# Patient Record
Sex: Female | Born: 1960 | Marital: Married | Smoking: Never smoker
Health system: Southern US, Community
[De-identification: ages and names within clinical notes are randomized; demographics above are authoritative.]

## PROBLEM LIST (undated history)

## (undated) DIAGNOSIS — S4990XA Unspecified injury of shoulder and upper arm, unspecified arm, initial encounter: Secondary | ICD-10-CM

## (undated) HISTORY — PX: ABDOMINAL HYSTERECTOMY: SHX81

---

## 2015-06-16 ENCOUNTER — Emergency Department (HOSPITAL_COMMUNITY): Payer: BLUE CROSS/BLUE SHIELD

## 2015-06-16 ENCOUNTER — Encounter (HOSPITAL_COMMUNITY): Payer: Self-pay | Admitting: Cardiology

## 2015-06-16 ENCOUNTER — Inpatient Hospital Stay (HOSPITAL_COMMUNITY)
Admission: EM | Admit: 2015-06-16 | Discharge: 2015-06-18 | DRG: 552 | Disposition: A | Discharge status: Home / Self Care | Payer: BLUE CROSS/BLUE SHIELD | Attending: Internal Medicine | Admitting: Internal Medicine

## 2015-06-16 ENCOUNTER — Inpatient Hospital Stay (HOSPITAL_COMMUNITY): Payer: BLUE CROSS/BLUE SHIELD

## 2015-06-16 DIAGNOSIS — M50122 Cervical disc disorder at C5-C6 level with radiculopathy: Secondary | ICD-10-CM | POA: Diagnosis present

## 2015-06-16 DIAGNOSIS — M5416 Radiculopathy, lumbar region: Secondary | ICD-10-CM | POA: Diagnosis not present

## 2015-06-16 DIAGNOSIS — M5116 Intervertebral disc disorders with radiculopathy, lumbar region: Principal | ICD-10-CM | POA: Diagnosis present

## 2015-06-16 DIAGNOSIS — M4802 Spinal stenosis, cervical region: Secondary | ICD-10-CM | POA: Diagnosis present

## 2015-06-16 DIAGNOSIS — M5136 Other intervertebral disc degeneration, lumbar region: Secondary | ICD-10-CM

## 2015-06-16 DIAGNOSIS — M545 Low back pain: Secondary | ICD-10-CM | POA: Diagnosis not present

## 2015-06-16 DIAGNOSIS — M549 Dorsalgia, unspecified: Secondary | ICD-10-CM | POA: Diagnosis present

## 2015-06-16 DIAGNOSIS — M5030 Other cervical disc degeneration, unspecified cervical region: Secondary | ICD-10-CM | POA: Insufficient documentation

## 2015-06-16 DIAGNOSIS — R262 Difficulty in walking, not elsewhere classified: Secondary | ICD-10-CM

## 2015-06-16 DIAGNOSIS — M5412 Radiculopathy, cervical region: Secondary | ICD-10-CM | POA: Diagnosis present

## 2015-06-16 DIAGNOSIS — M51369 Other intervertebral disc degeneration, lumbar region without mention of lumbar back pain or lower extremity pain: Secondary | ICD-10-CM

## 2015-06-16 DIAGNOSIS — R531 Weakness: Secondary | ICD-10-CM

## 2015-06-16 DIAGNOSIS — M4806 Spinal stenosis, lumbar region: Secondary | ICD-10-CM | POA: Diagnosis present

## 2015-06-16 HISTORY — DX: Unspecified injury of shoulder and upper arm, unspecified arm, initial encounter: S49.90XA

## 2015-06-16 LAB — CBC WITH DIFFERENTIAL/PLATELET
Basophils Absolute: 0 10*3/uL (ref 0.0–0.1)
Basophils Relative: 1 %
EOS ABS: 0 10*3/uL (ref 0.0–0.7)
EOS PCT: 0 %
HEMATOCRIT: 36.9 % (ref 36.0–46.0)
Hemoglobin: 12.6 g/dL (ref 12.0–15.0)
LYMPHS PCT: 28 %
Lymphs Abs: 1.7 10*3/uL (ref 0.7–4.0)
MCH: 29.8 pg (ref 26.0–34.0)
MCHC: 34.1 g/dL (ref 30.0–36.0)
MCV: 87.2 fL (ref 78.0–100.0)
Monocytes Absolute: 0.4 10*3/uL (ref 0.1–1.0)
Monocytes Relative: 6 %
Neutro Abs: 4.1 10*3/uL (ref 1.7–7.7)
Neutrophils Relative %: 65 %
PLATELETS: 247 10*3/uL (ref 150–400)
RBC: 4.23 MIL/uL (ref 3.87–5.11)
RDW: 13.4 % (ref 11.5–15.5)
WBC: 6.2 10*3/uL (ref 4.0–10.5)

## 2015-06-16 LAB — URINALYSIS, ROUTINE W REFLEX MICROSCOPIC
BILIRUBIN URINE: NEGATIVE
Glucose, UA: NEGATIVE mg/dL
HGB URINE DIPSTICK: NEGATIVE
Ketones, ur: NEGATIVE mg/dL
Leukocytes, UA: NEGATIVE
NITRITE: NEGATIVE
PROTEIN: NEGATIVE mg/dL
Specific Gravity, Urine: 1.015 (ref 1.005–1.030)
pH: 8.5 — ABNORMAL HIGH (ref 5.0–8.0)

## 2015-06-16 LAB — MAGNESIUM: MAGNESIUM: 2.1 mg/dL (ref 1.7–2.4)

## 2015-06-16 LAB — COMPREHENSIVE METABOLIC PANEL
ALT: 14 U/L (ref 14–54)
AST: 19 U/L (ref 15–41)
Albumin: 4.2 g/dL (ref 3.5–5.0)
Alkaline Phosphatase: 47 U/L (ref 38–126)
Anion gap: 10 (ref 5–15)
BILIRUBIN TOTAL: 0.8 mg/dL (ref 0.3–1.2)
BUN: 13 mg/dL (ref 6–20)
CHLORIDE: 105 mmol/L (ref 101–111)
CO2: 23 mmol/L (ref 22–32)
Calcium: 9.3 mg/dL (ref 8.9–10.3)
Creatinine, Ser: 0.63 mg/dL (ref 0.44–1.00)
Glucose, Bld: 97 mg/dL (ref 65–99)
POTASSIUM: 3.4 mmol/L — AB (ref 3.5–5.1)
Sodium: 138 mmol/L (ref 135–145)
TOTAL PROTEIN: 7.6 g/dL (ref 6.5–8.1)

## 2015-06-16 MED ORDER — ALUM & MAG HYDROXIDE-SIMETH 200-200-20 MG/5ML PO SUSP: 30.0000 mL | Freq: Four times a day (QID) | ORAL | Status: DC | PRN

## 2015-06-16 MED ORDER — POLYETHYLENE GLYCOL 3350 17 G PO PACK: 17.0000 g | PACK | Freq: Every day | ORAL | Status: DC | PRN

## 2015-06-16 MED ORDER — HYDROMORPHONE HCL 1 MG/ML IJ SOLN
0.5000 mg | Freq: Once | INTRAMUSCULAR | Status: AC
  Administered 2015-06-16: 0.5 mg via INTRAVENOUS
  Filled 2015-06-16: qty 1

## 2015-06-16 MED ORDER — PROMETHAZINE HCL 12.5 MG PO TABS: 12.5000 mg | ORAL_TABLET | Freq: Four times a day (QID) | ORAL | Status: DC | PRN

## 2015-06-16 MED ORDER — ONDANSETRON HCL 4 MG/2ML IJ SOLN
4.0000 mg | Freq: Once | INTRAMUSCULAR | Status: AC
  Administered 2015-06-16: 4 mg via INTRAVENOUS
  Filled 2015-06-16: qty 2

## 2015-06-16 MED ORDER — OXYCODONE HCL 5 MG PO TABS: 5.0000 mg | ORAL_TABLET | ORAL | Status: DC | PRN

## 2015-06-16 MED ORDER — SODIUM CHLORIDE 0.9 % IV SOLN
INTRAVENOUS | Status: DC
  Administered 2015-06-16: 16:00:00 via INTRAVENOUS

## 2015-06-16 MED ORDER — ACETAMINOPHEN 325 MG PO TABS: 650.0000 mg | ORAL_TABLET | Freq: Four times a day (QID) | ORAL | Status: DC | PRN

## 2015-06-16 MED ORDER — SODIUM CHLORIDE 0.9 % IJ SOLN
3.0000 mL | Freq: Two times a day (BID) | INTRAMUSCULAR | Status: DC
  Administered 2015-06-16 – 2015-06-17 (×2): 3 mL via INTRAVENOUS

## 2015-06-16 MED ORDER — MORPHINE SULFATE (PF) 4 MG/ML IV SOLN
4.0000 mg | Freq: Once | INTRAVENOUS | Status: AC
  Administered 2015-06-16: 4 mg via INTRAVENOUS
  Filled 2015-06-16: qty 1

## 2015-06-16 MED ORDER — ENOXAPARIN SODIUM 40 MG/0.4ML ~~LOC~~ SOLN
40.0000 mg | SUBCUTANEOUS | Status: DC
  Filled 2015-06-16: qty 0.4

## 2015-06-16 MED ORDER — LEVALBUTEROL HCL 0.63 MG/3ML IN NEBU: 0.6300 mg | INHALATION_SOLUTION | Freq: Four times a day (QID) | RESPIRATORY_TRACT | Status: DC | PRN

## 2015-06-16 MED ORDER — KETOROLAC TROMETHAMINE 30 MG/ML IJ SOLN
30.0000 mg | Freq: Once | INTRAMUSCULAR | Status: AC
  Administered 2015-06-16: 30 mg via INTRAVENOUS
  Filled 2015-06-16: qty 1

## 2015-06-16 MED ORDER — METHYLPREDNISOLONE SODIUM SUCC 125 MG IJ SOLR
80.0000 mg | Freq: Once | INTRAMUSCULAR | Status: AC
  Administered 2015-06-16: 80 mg via INTRAVENOUS
  Filled 2015-06-16: qty 2

## 2015-06-16 MED ORDER — POTASSIUM CHLORIDE IN NACL 40-0.9 MEQ/L-% IV SOLN
INTRAVENOUS | Status: DC
  Administered 2015-06-16: 75 mL/h via INTRAVENOUS

## 2015-06-16 MED ORDER — HYDROMORPHONE HCL 1 MG/ML IJ SOLN: 0.5000 mg | INTRAMUSCULAR | Status: DC | PRN

## 2015-06-16 MED ORDER — METHYLPREDNISOLONE SODIUM SUCC 125 MG IJ SOLR
80.0000 mg | Freq: Two times a day (BID) | INTRAMUSCULAR | Status: DC
  Administered 2015-06-16 – 2015-06-17 (×2): 80 mg via INTRAVENOUS
  Filled 2015-06-16 (×2): qty 2

## 2015-06-16 MED ORDER — GADOBENATE DIMEGLUMINE 529 MG/ML IV SOLN
10.0000 mL | Freq: Once | INTRAVENOUS | Status: AC | PRN
  Administered 2015-06-16: 10 mL via INTRAVENOUS

## 2015-06-16 MED ORDER — MORPHINE SULFATE (PF) 2 MG/ML IV SOLN
2.0000 mg | Freq: Once | INTRAVENOUS | Status: AC
  Administered 2015-06-16: 2 mg via INTRAVENOUS
  Filled 2015-06-16: qty 1

## 2015-06-16 MED ORDER — ACETAMINOPHEN 650 MG RE SUPP: 650.0000 mg | Freq: Four times a day (QID) | RECTAL | Status: DC | PRN

## 2015-06-16 MED ORDER — METHOCARBAMOL 1000 MG/10ML IJ SOLN
500.0000 mg | Freq: Three times a day (TID) | INTRAVENOUS | Status: DC | PRN
  Administered 2015-06-16: 500 mg via INTRAVENOUS
  Filled 2015-06-16: qty 5

## 2015-06-16 NOTE — ED Provider Notes (Signed)
CSN: [id number]     Arrival date & time 06/16/15  1059 History   First MD Initiated Contact with Patient 06/16/15 1109     Chief Complaint  Patient presents with  . Back Pain     (Consider location/radiation/quality/duration/timing/severity/associated sxs/prior Treatment) HPI Comments: Patient is a 54 year old female who presents to the emergency department with a complaint of low back pain. The patient's husband as the primary historian. He states that she has been having problems since May of this year. She has been seen by a [facility] for orthopedics in the past because of shoulder problems and was told that she did not have arthritis there, and was placed on physical therapy. She continued to have pain and would wake up with pain and in the last 9 days has been having problems with pain both in the shoulder and in the lower back area. The patient was having problems with stiffness of the muscles and back a week ago and was eventually seen by chiropractic physician for massage, acupuncture, and other physical therapies. This helped for short period of time, but the pain came back, and was worse. The patient was seen on yesterday December 5 chiropractic again with acupuncture, massage, and other modalities without significant improvement in the back pain or shoulder pain. The patient's custom is to sleep on the floor, she is now in a position where that she has to wear depends because she cannot get up off the lower due to the severity of her pain. She came to the emergency department today by EMS for evaluation and for assistance with her pain.  Patient is a 54 y.o. female presenting with back pain. The history is provided by the spouse. The history is limited by a language barrier (husband interprets for patient.).  Back Pain   Past Medical History  Diagnosis Date  . Shoulder injury    Past Surgical History  Procedure Laterality Date  . Abdominal hysterectomy     History reviewed. No  pertinent family history. Social History  Substance Use Topics  . Smoking status: None  . Smokeless tobacco: None  . Alcohol Use: None   OB History    No data available     Review of Systems  Musculoskeletal: Positive for back pain, arthralgias and neck pain.  All other systems reviewed and are negative.     Allergies  Review of patient's allergies indicates no known allergies.  Home Medications   Prior to Admission medications   Not on File   BP 140/69 mmHg  Pulse 68  Temp(Src) 98.2 F (36.8 C) (Oral)  Resp 20  Ht 5\' 6"  (1.676 m)  Wt 56.246 kg  BMI 20.02 kg/m2  SpO2 100% Physical Exam  Constitutional: She is oriented to person, place, and time. She appears well-developed and well-nourished.  Non-toxic appearance.  HENT:  Head: Normocephalic.  Right Ear: Tympanic membrane and external ear normal.  Left Ear: Tympanic membrane and external ear normal.  Eyes: EOM and lids are normal. Pupils are equal, round, and reactive to light.  Neck: Normal range of motion. Neck supple. Carotid bruit is not present.  Cardiovascular: Normal rate, regular rhythm, normal heart sounds, intact distal pulses and normal pulses.   Pulmonary/Chest: Breath sounds normal. No respiratory distress.  Abdominal: Soft. Bowel sounds are normal. There is no tenderness. There is no guarding.  Musculoskeletal: Normal range of motion.  There is pain of the right greater than left shoulders with range of motion. There is no evidence for  dislocation. There no hot joints appreciated. This full range of motion of both the right and left elbow, wrist, and fingers.  There is soreness of the upper trapezius right greater than left. There is no palpable step off of the cervical, thoracic, or lumbar spine. There is tenderness of the cervical paraspinal area, and the lumbar paraspinal area. There is pain to palpation along the lumbar spinal area. No hot areas appreciated.  Lymphadenopathy:       Head (right  side): No submandibular adenopathy present.       Head (left side): No submandibular adenopathy present.    She has no cervical adenopathy.  Neurological: She is alert and oriented to person, place, and time. She has normal strength. No cranial nerve deficit or sensory deficit.  Examination is limited because of pain. The grip is symmetrical. The patient can flex the knees, and wrist of the lateral and the stretcher. She has severe pain when asked to bring her knees toward her chest, or to extend her legs. No sensory deficits of the upper or lower extremities identified.  Skin: Skin is warm and dry.  Psychiatric: She has a normal mood and affect. Her speech is normal.  Nursing note and vitals reviewed.   ED Course Pt seen with me by Dr D. Verdie MoAlgis Downssher.  Procedures (including critical care time) Labs Review Labs Reviewed - No data to display  Imaging Review No results found. I have personally reviewed and evaluated these images and lab results as part of my medical decision-making.   EKG Interpretation None      MDM  Urine analysis is well within normal limits. Complete blood count is well within normal limits. [facility] metabolic panel shows potassium to be slightly low at 3.4, otherwise normal.  CT scan of the cervical spine reveals cervical spondylosis and degenerative disc disease with potential impingement at the C5-C6 area. 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 There is also a small disc protrusion at C2-C3 and C3-C4 area with borderline central narrowing of the thecal sac.  The CT scan of the lumbar spine reveals a left lateral recess disc extrusion extending caudal and impinging on the left L4 nerve root. There is also borderline impingement at the L4-L5 area due to degenerative disc disease.  The patient was treated initially with 2 mg of morphine and 30 mg of Toradol with only  minimal improvement in pain. The patient was then treated with 4 mg of morphine with minimal improvement in pain. The patient was then treated with 0.5 mg of Dilaudid with minimal improvement in pain, patient still unable to get out of the bed because of pain. Will discuss admission with the hospitalist.    Final diagnoses:  Intractable back pain  DDD (degenerative disc disease), lumbar  DDD (degenerative disc disease), cervical    **I have reviewed nursing notes, vital signs, and all appropriate lab and imaging results for this patient.Ivery Quale, PA-C 06/16/15 1601  Lavera Guise, MD 06/17/15 [phone]

## 2015-06-16 NOTE — ED Notes (Signed)
H. Bryant, PA at bedside. 

## 2015-06-16 NOTE — ED Notes (Signed)
Lower back pain for 9 days.  States pain came on after getting up out of the floor. Has been to a chiropractor last Thursday and acupuncture yesterday.  Pain better shortly after visiting chiropractor.   States now pain is worse and also c/o pain in neck.  Pt unable to get up ,  Had to call EMS for transport.

## 2015-06-16 NOTE — H&P (Addendum)
[facility] History and Physical  Aarohi Redditt VWU:[id number] DOB: [birth year] DOA: 06/16/2015  Referring physician:   PCP: No primary care provider on file.   Chief Complaint: *Intractable back pain, shoulder pain  HPI:  54 year old female with no significant past medical history presents with intractable cervical and lumbar pain for the last 9 days. Patient states that the pain has progressively becoming worse. She did  have acupuncture and chiropractic treatment without any significant improvement in her back pain. She has not taken any NSAIDs or painkillers. She has not taken any muscle relaxants. She has not seen her PCP. Since yesterday the patient has had difficulty ambulating and inability to get off her mattress on the floor. She denies any trauma, denies any urinary or stool incontinence. She thinks her left leg is weaker than her right leg.  CT cervical spine shows cervical spondylosis and degenerative changes with potential impingement at C5-C6, disc protrusions at C2-C3 and C3-C4 with borderline associated central narrowing of the thecal sac. At L3-4, left lateral recess disc extrusion extending caudate and likely impinging the left L4 nerve root. Borderline impingement at L4-L5 due to degenerative disc disease    Review of Systems: negative for the following  Constitutional: Denies fever, chills, diaphoresis, appetite change and fatigue.  HEENT: Denies photophobia, eye pain, redness, hearing loss, ear pain, congestion, sore throat, rhinorrhea, sneezing, mouth sores, trouble swallowing, neck pain, neck stiffness and tinnitus.  Respiratory: Denies SOB, DOE, cough, chest tightness, and wheezing.  Cardiovascular: Denies chest pain, palpitations and leg swelling.  Gastrointestinal: Denies nausea, vomiting, abdominal pain, diarrhea, constipation, blood in stool and abdominal distention.  Genitourinary: Denies dysuria, urgency, frequency, hematuria, flank pain and difficulty urinating.   Musculoskeletal: Positive for back pain, arthralgias and neck painSkin: Denies pallor, rash and wound.  Neurological: Denies dizziness, seizures, syncope, weakness, light-headedness, numbness and headaches.  Hematological: Denies adenopathy. Easy bruising, personal or family bleeding history  Psychiatric/Behavioral: Denies suicidal ideation, mood changes, confusion, nervousness, sleep disturbance and agitation       Past Medical History  Diagnosis Date  . Shoulder injury      Past Surgical History  Procedure Laterality Date  . Abdominal hysterectomy        Social History:  has no tobacco, alcohol, and drug history on file.    No Known Allergies      FAMILY HISTORY  When questioned  Directly-patient reports  No family history of HTN, CVA ,DIABETES, TB, Cancer CAD, Bleeding Disorders, Sickle Cell, diabetes, anemia, asthma,   Prior to Admission medications   Medication Sig Start Date End Date Taking? Authorizing Provider  vitamin B-12 (CYANOCOBALAMIN) 1000 MCG tablet Take 1,000 mcg by mouth daily.   Yes Historical Provider, MD     Physical Exam: Filed Vitals:   06/16/15 1530 06/16/15 1600 06/16/15 1630 06/16/15 1645  BP: 132/76 118/76 122/76 122/76  Pulse:   61 88  Temp:    98.3 F (36.8 C)  TempSrc:    Oral  Resp:    14  Height:      Weight:      SpO2:   98% 100%     Constitutional: Vital signs reviewed. Patient is a well-developed and well-nourished in no acute distress and cooperative with exam. Alert and oriented x3.  Head: Normocephalic and atraumatic  Ear: TM normal bilaterally  Mouth: no erythema or exudates, MMM  Eyes: PERRL, EOMI, conjunctivae normal, No scleral icterus.  Neck: Supple, Trachea midline normal ROM, No JVD, mass, thyromegaly, or carotid bruit  present.  Cardiovascular: RRR, S1 normal, S2 normal, no MRG, pulses symmetric and intact bilaterally  Pulmonary/Chest: CTAB, no wheezes, rales, or rhonchi  Abdominal: Soft. Non-tender,  non-distended, bowel sounds are normal, no masses, organomegaly, or guarding present.  GU: no CVA tenderness Musculoskeletal: No joint deformities, erythema, or stiffness, ROM full and no nontender Ext: no edema and no cyanosis, pulses palpable bilaterally (DP and PT)  Hematology: no cervical, inginal, or axillary adenopathy.  Musculoskeletal: Normal range of motion.  There is pain of the right greater than left shoulders with range of motion. There is no evidence for dislocation. There no hot joints appreciated. This full range of motion of both the right and left elbow, wrist, and fingers.  There is soreness of the upper trapezius right greater than left. There is no palpable step off of the cervical, thoracic, or lumbar spine. There is tenderness of the cervical paraspinal area, and the lumbar paraspinal area. There is pain to palpation along the lumbar spinal area. No hot areas appreciated.  Lymphadenopathy:   Head (right side): No submandibular adenopathy present.   Head (left side): No submandibular adenopathy present.   She has no cervical adenopathy.  Neurological: She is alert and oriented to person, place, and time. She has normal strength. No cranial nerve deficit or sensory deficit.  Examination is limited because of pain. The grip is symmetrical. The patient can flex the knees, and wrist of the lateral and the stretcher. She has severe pain when asked to bring her knees toward her chest, or to extend her legs. No sensory deficits of the upper or lower extremities identified.  Skin: Warm, dry and intact. No rash, cyanosis, or clubbing.  Psychiatric: Normal mood and affect. speech and behavior is normal. Judgment and thought content normal. Cognition and memory are normal.      Data Review   Micro Results No results found for this or any previous visit (from the past 240 hour(s)).  Radiology Reports Ct Cervical Spine Wo Contrast  06/16/2015  CLINICAL DATA:  Neck pain  after standing from floor. Recent chiropractor visit for back pain. EXAM: CT CERVICAL SPINE WITHOUT CONTRAST TECHNIQUE: Multidetector CT imaging of the cervical spine was performed without intravenous contrast. Multiplanar CT image reconstructions were also generated. COMPARISON:  None. FINDINGS: Ossification of the posterior longitudinal ligament extending from the C7 level down to the lower T1 level as shown on images 31-32 of series 5. 2 mm posterior subluxation at C5-6 thought to be degenerative. Uncinate spurring, mild facet arthropathy, and possible degenerative disc disease in the right neural foramen at C5-6 a potential cause for right foraminal impingement. There is also some left uncinate spurring at C5-6 and C6-7 without overt osseous foraminal stenosis at these levels. No prevertebral soft tissue swelling. I suspect small central disc protrusions at C2-3 and C3-4 and there is potentially mild central narrowing of the thecal sac at C5-6 due to disc bulge. Biapical pleural parenchymal scarring appears relatively symmetric. Upper normal size bilateral station 2 lymph nodes including an 8 mm short axis lymph node on image 35 series 3. IMPRESSION: 1. Cervical spondylosis and degenerative disc disease with potential impingement at C5-6. 2. 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 3. 2 mm degenerative posterior subluxation at C5-6. 4. In the lungs, there is biapical pleural parenchymal scarring which appear symmetric. 5. Small central disc protrusions at C2- 3 and C3-4 with borderline associated central narrowing of the thecal sac. Electronically Signed   By: Zollie Beckers  Ova Freshwater M.D.   On: 06/16/2015 13:05   Ct Lumbar Spine Wo Contrast  06/16/2015  CLINICAL DATA:  Low back pain for 9 days, difficulty standing or moving. EXAM: CT LUMBAR SPINE WITHOUT CONTRAST TECHNIQUE: Multidetector CT imaging of the lumbar spine was performed without intravenous contrast administration. Multiplanar  CT image reconstructions were also generated. COMPARISON:  None. FINDINGS: The lowest lumbar type non-rib-bearing vertebra is labeled as L5. No lumbar spine fracture or malalignment is observed. The visualized abdominal contents unremarkable on noncontrast CT appearance, including a normal-appearing appendix. Additional findings at individual levels are as follows: L1-2: Unremarkable. L2-3:  Unremarkable. L3-4: Left lateral recess disc extrusion is suspected to extend caudad and probably causes impingement on the left L4 nerve roots. Left eccentric disc bulge with borderline central narrowing of the thecal sac. L4-5: Borderline central narrowing of the thecal sac and borderline bilateral subarticular lateral recess stenosis due to disc bulge. L5-S1:  No impingement seen.  Mild disc bulge. IMPRESSION: 1. At L3-4, I suspect a left lateral recess disc extrusion extending caudad and likely impinging on the left L4 nerve roots. 2. Borderline impingement at L4-5 due to degenerative disc disease. 3. No fracture or malalignment identified. Electronically Signed   By: Gaylyn Rong M.D.   On: 06/16/2015 13:11     CBC  Recent Labs Lab 06/16/15 1238  WBC 6.2  HGB 12.6  HCT 36.9  PLT 247  MCV 87.2  MCH 29.8  MCHC 34.1  RDW 13.4  LYMPHSABS 1.7  MONOABS 0.4  EOSABS 0.0  BASOSABS 0.0    Chemistries   Recent Labs Lab 06/16/15 1238  NA 138  K 3.4*  CL 105  CO2 23  GLUCOSE 97  BUN 13  CREATININE 0.63  CALCIUM 9.3  MG 2.1  AST 19  ALT 14  ALKPHOS 47  BILITOT 0.8   ------------------------------------------------------------------------------------------------------------------ estimated creatinine clearance is 70.5 mL/min (by C-G formula based on Cr of 0.63). ------------------------------------------------------------------------------------------------------------------ No results for input(s): HGBA1C in the last 72  hours. ------------------------------------------------------------------------------------------------------------------ No results for input(s): CHOL, HDL, LDLCALC, TRIG, CHOLHDL, LDLDIRECT in the last 72 hours. ------------------------------------------------------------------------------------------------------------------ No results for input(s): TSH, T4TOTAL, T3FREE, THYROIDAB in the last 72 hours.  Invalid input(s): FREET3 ------------------------------------------------------------------------------------------------------------------ No results for input(s): VITAMINB12, FOLATE, FERRITIN, TIBC, IRON, RETICCTPCT in the last 72 hours.  Coagulation profile No results for input(s): INR, PROTIME in the last 168 hours.  No results for input(s): DDIMER in the last 72 hours.  Cardiac Enzymes No results for input(s): CKMB, TROPONINI, MYOGLOBIN in the last 168 hours.  Invalid input(s): CK ------------------------------------------------------------------------------------------------------------------ Invalid input(s): POCBNP   CBG: No results for input(s): GLUCAP in the last 168 hours.     EKG: Independently reviewed.     Assessment/Plan Principal Problem:   Intractable back pain-multiple findings on CT cervical and lumbar spine Discussed with Dr. Bevely Palmer with [facility], he recommends MRI He recommends continued treatment with steroids and muscle relaxants He recommends MRI of the cervical spine and the lumbar spine Please call him back tonight if the MRI is grossly of normal Patient may need transfer to [facility] for further management of her back pain tonight, if any signs of cord compression      Code Status:   full Family Communication: Discussed with the patient's husband by the bedside Disposition Plan: admit   Total time spent 55 minutes.Greater than 50% of this time was spent in counseling, explanation of diagnosis, planning of further management,  and coordination of care  [facility] Pager [phone]  If 7PM-7AM, please contact night-coverage [email or web address] Password [email or web address] 06/16/2015, 5:05 PM

## 2015-06-16 NOTE — ED Notes (Signed)
Pt still not able to get out of bed due to pain

## 2015-06-17 DIAGNOSIS — M5412 Radiculopathy, cervical region: Secondary | ICD-10-CM

## 2015-06-17 DIAGNOSIS — M5416 Radiculopathy, lumbar region: Secondary | ICD-10-CM

## 2015-06-17 DIAGNOSIS — M549 Dorsalgia, unspecified: Secondary | ICD-10-CM

## 2015-06-17 LAB — COMPREHENSIVE METABOLIC PANEL
ALT: 16 U/L (ref 14–54)
AST: 20 U/L (ref 15–41)
Albumin: 3.7 g/dL (ref 3.5–5.0)
Alkaline Phosphatase: 48 U/L (ref 38–126)
Anion gap: 5 (ref 5–15)
BUN: 17 mg/dL (ref 6–20)
CALCIUM: 8.8 mg/dL — AB (ref 8.9–10.3)
CHLORIDE: 108 mmol/L (ref 101–111)
CO2: 24 mmol/L (ref 22–32)
CREATININE: 0.61 mg/dL (ref 0.44–1.00)
Glucose, Bld: 122 mg/dL — ABNORMAL HIGH (ref 65–99)
Potassium: 3.6 mmol/L (ref 3.5–5.1)
Sodium: 137 mmol/L (ref 135–145)
TOTAL PROTEIN: 6.9 g/dL (ref 6.5–8.1)
Total Bilirubin: 0.9 mg/dL (ref 0.3–1.2)

## 2015-06-17 LAB — CBC
HCT: 36 % (ref 36.0–46.0)
Hemoglobin: 12.1 g/dL (ref 12.0–15.0)
MCH: 29.5 pg (ref 26.0–34.0)
MCHC: 33.6 g/dL (ref 30.0–36.0)
MCV: 87.8 fL (ref 78.0–100.0)
PLATELETS: 248 10*3/uL (ref 150–400)
RBC: 4.1 MIL/uL (ref 3.87–5.11)
RDW: 13.5 % (ref 11.5–15.5)
WBC: 6.4 10*3/uL (ref 4.0–10.5)

## 2015-06-17 MED ORDER — ACETAMINOPHEN 325 MG RE SUPP: 325.0000 mg | Freq: Four times a day (QID) | RECTAL | Status: DC | PRN

## 2015-06-17 MED ORDER — ACETAMINOPHEN 325 MG PO TABS: 650.0000 mg | ORAL_TABLET | Freq: Three times a day (TID) | ORAL | Status: DC

## 2015-06-17 MED ORDER — METHOCARBAMOL 500 MG PO TABS
500.0000 mg | ORAL_TABLET | Freq: Three times a day (TID) | ORAL | Status: DC
  Administered 2015-06-17: 500 mg via ORAL
  Filled 2015-06-17: qty 1

## 2015-06-17 MED ORDER — ACETAMINOPHEN 325 MG PO TABS: 325.0000 mg | ORAL_TABLET | Freq: Four times a day (QID) | ORAL | Status: DC | PRN

## 2015-06-17 MED ORDER — METHYLPREDNISOLONE SODIUM SUCC 125 MG IJ SOLR
60.0000 mg | Freq: Two times a day (BID) | INTRAMUSCULAR | Status: DC
  Administered 2015-06-17: 60 mg via INTRAVENOUS
  Filled 2015-06-17: qty 2

## 2015-06-17 NOTE — Plan of Care (Signed)
Problem: Acute Rehab PT Goals(only PT should resolve) Goal: Pt Will Ambulate Pt will ambulate independently using a step-through pattern and equal step length for a distances greater than 54900ft without exacerbation of low back pain to demonstrate the ability to tolerate limited community distance ambulation at discharge.

## 2015-06-17 NOTE — Evaluation (Signed)
Physical Therapy Evaluation Patient Details Name: Janet Whitaker MRN: [id number] DOB: [birth year] Today's Date: 06/17/2015   History of Present Illness  Pt is a 54yo Asian woman who speaks primarily [place] (very little [place]) who presented to ED after immobilizing, unrelenting low back pain. Communication this session is facilitated through her husband. Pt began to experience pain on 11/27 c incidious onset. Pt denies trauma, or prior history of LBP. Pt gave birth to twins 25 years ago via cesearean, but denies any back pain after recovering from that surgery. 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 upon onset of pain, pt remained fairly mobile in the community. She took two sessions of treatment with a chiropractor which involved acupuncture, trigger point dry needling, and electrical stimulation.. Pt reports only mild improvement with treatments. Recently for pain relief patient laid down on bed to with ice pack beneath lumbar curvature for 40 minutes, after which she reports she became cold all over her body and began to experience intractable back pain and shooting nerve pain superfiscial to the region of the sacrum and the L posterior glute med/max from iliac crest to the level of the piriformis. Reports some instances of pain in the R medial groin, but otherwise denies any burning, numbness tingling in the BLE. Imaging this admission reveals some likely L LE nerve root impingement and DDD.  Of Note pt also has a 10 month history of bilateral shoulder pain, suspected to be cervical radiculopathy, for which she  has saught ortho consult as well as PT initiation without completetion. This pain remains stable and the same as prior to admission.   Clinical Impression  Pt is received supine in bed upon entry, awake, alert, and willing to participate, but expressing some anxiety. RN administers pain meds prior to commencement, and husband is at bedside to help  with communication. Pt is A&Ox3 and pleasant.  Physical examination performed with some limitations to patient tolerance of position, however findings reveal the following:  Strength: Normal, strong, symmetrical, and pain free in BLE. Bridging is limited equally by pain and weakness. Functional mobility reveals normal strength with cautious, slow movement.  Light touch sensation: Intact bilaterally in dermatomes L1-S1. Posture/soft tissue assessment: Severely self limited tolerance to changed in trunk alignment, which she related to her piano posture. Lumbar spine demonstrates mild lordosis in extension, and is without curvature during forward flexion. Functional mobility testing reveals that patient maintains a rigid trunk during postural changes, obtaining most mobility from hypermobile hip joints or cervical spine. Thoracic spine and shoulder girdle do not move normally with trunk rotation. Both in supine and stance, R ASIS sits ~4-5 CM superior (posterior innominate rotation). No palpable volume asymmetry can be identified within the paraspinals glute med, or glute max bilaterally. Gross motor control is significantly asymmetrical in lumbar paraspinals with delayed firing on the left by ~0.5 seconds. Repeated seated flexion creates some increased pain and soreness in the L lateral iliocostalis which resolves by 50% with 3 minutes of myofascial release. Muscle bulk of the lumbar paraspinals is moderately less than expected for age/gender.   Special Tests/Screening: Testing for directional preference is negative however some noted soreness on left with L side bending, which remains sore and stable throughout. Pelvic provocation tests are all negative with exception to the L thigh thrust, lacking sufficient scoring to suspect involvement of sacroiliac joints as a pain generator. Patrick's test bilat only elicits some mild lateral knee discomfort.  Slump test to determine likelihood of disc involvement is not  performed at this time due to lumbar hypomobility and history of cervical radiculopathy.   Patient presenting with impairment of strength, range of motion, motor control, posture, and activity tolerance, limiting ability to perform ADL, IADL, and mobility tasks at  baseline level of function. Patient will benefit from skilled intervention to address the above impairments and limitations, in order to restore to prior level of function, improve patient safety upon discharge, and to decrease falls risk. Pt is appropriate for DC to home at this time and is pending outpatient consult with a neurologist after DC. Pt would benefit from immediate start of OPPT s/p DC from hospital, while awaiting neuro consult.      Follow Up Recommendations Outpatient PT (Pt has an OP neuro consult pending; would benefit with immediate commencement of PT while awaiting neurologist appointment. Pt lives in [place] but is interested in coming to [facility] OPPT. )    Equipment Recommendations  None recommended by PT    Recommendations for Other Services       Precautions / Restrictions Precautions Precautions: None Restrictions Weight Bearing Restrictions: No      Mobility  Bed Mobility Overal bed mobility: Modified Independent             General bed mobility comments: Pt demos difficulty with bridging due to pain and weakness. Education on bed rolling; pt able to perform well with limited education; task is challenging due to history of shoulder pain and cervical pathology.   Transfers Overall transfer level: Independent Equipment used: None             General transfer comment: Pt performing 20+ STS throughout the session ad lib to keep moving and remain loose.   Ambulation/Gait Ambulation/Gait assistance: Supervision Ambulation Distance (Feet): 50 Feet Assistive device: None Gait Pattern/deviations: WFL(Within Functional Limits)   Gait velocity interpretation: <1.8 ft/sec, indicative of risk  for recurrent falls General Gait Details: Slow and cautious; PT limited distance due to time and good performance.   Stairs            Wheelchair Mobility    Modified Rankin (Stroke Patients Only)       Balance Overall balance assessment: No apparent balance deficits (not formally assessed) (able to attempt SLS pain free bilat. )                                           Pertinent Vitals/Pain Pain Assessment: 0-10 Pain Score: 0-No pain Pain Location: Skin superfiscial the sacrum as well as Left upper posterior iliac crest area (glute med/max). The Pain free at rest, mild soreness on L with movement; discomfort/intolerance to sitting back in chair for more than 3 minutes.  Pain Descriptors / Indicators: Shooting Pain Intervention(s): Limited activity within patient's tolerance;Monitored during session;Premedicated before session;Repositioned    Home Living Family/patient expects to be discharged to:: Private residence Living Arrangements: Spouse/significant other Available Help at Discharge: Family Type of Home: House Home Access: Stairs to enter Entrance Stairs-Rails: Can reach both Entrance Stairs-Number of Steps: 2 Home Layout: One level Home Equipment: None      Prior Function                 Hand Dominance        Extremity/Trunk Assessment   Upper Extremity Assessment: Defer to OT evaluation  Lower Extremity Assessment:  (MMT screening demonstrates 5/5 strength with symmetry.  Light touch sensation is intact.  Repeated movements of the lumbar spine do not elicit pain response in the BLE whatsoever. )      Cervical / Trunk Assessment:  (deferred at this time due to complex cervical radiculopathy history. )  Communication      Cognition Arousal/Alertness: Awake/alert Behavior During Therapy: WFL for tasks assessed/performed Overall Cognitive Status: Within Functional Limits for tasks assessed       Memory:  Decreased recall of precautions              General Comments General comments (skin integrity, edema, etc.): Patient appears mildly jaundiced in some parts of face, but may be patients complexion; sclerae appear to be free of discoloration.     Exercises Other Exercises Other Exercises: SKTC supine in bed 5x45 seconds bilat; non provactive.  Other Exercises: lower trunk/lumbar repeated rotation with fixed upper trunk in hooklying 1x20; non provacative.  Other Exercises: Anterior/Posterior pelvic rocking x15; very limited excursion Other Exercises: seated lateral sidebending x15 bilat; rigid trunk, requires tactile cues to isolate movement to trunk/spine      Assessment/Plan    PT Assessment Patient needs continued PT services  PT Diagnosis Acute pain;Generalized weakness   PT Problem List Decreased strength;Decreased range of motion;Decreased activity tolerance;Decreased mobility;Decreased coordination;Decreased knowledge of precautions;Pain  PT Treatment Interventions Gait training;Patient/family education;Functional mobility training;Therapeutic activities;Therapeutic exercise   PT Goals (Current goals can be found in the Care Plan section) Acute Rehab PT Goals Patient Stated Goal: Decrease pain with nonsurgical options if possible.  PT Goal Formulation: With patient/family Time For Goal Achievement: 07/01/15 Potential to Achieve Goals: Good    Frequency Min 3X/week   Barriers to discharge        Co-evaluation               End of Session   Activity Tolerance: Patient tolerated treatment well;No increased pain Patient left: in chair;with bed alarm set;with family/visitor present;with call bell/phone within reach Nurse Communication: Mobility status         Time: [id number] PT Time Calculation (min) (ACUTE ONLY): 95 min   Charges:   PT Evaluation $Initial PT Evaluation Tier I: 1 Procedure PT Treatments $Therapeutic Exercise: 8-22 mins $Self Care/Home  Management: 8-22   PT G Codes:        Buccola,Allan C 06/17/2015, 5:08 PM 5:28 PM  Rosamaria LintsAllan C Buccola, PT, DPT [place] License # [id number]

## 2015-06-17 NOTE — Care Management Note (Signed)
Case Management Note  Patient Details  Name: Janet Whitaker MRN: [id number] Date of Birth: 02/04/1961  Subjective/Objective:                  Pt admitted from home with intractable back pain. Pt lives with her husband and will return home at discharge. Pt is normally independent with ADL's.  Action/Plan: Will need PT eval prior to discharge. Will continue to follow for discharge planning needs.  Expected Discharge Date:                  Expected Discharge Plan:  Home w Home Health Services  In-House Referral:  NA  Discharge planning Services  CM Consult  Post Acute Care Choice:  Home Health Choice offered to:  Patient, Spouse  DME Arranged:    DME Agency:     HH Arranged:    HH Agency:     Status of Service:  In process, will continue to follow  Medicare Important Message Given:    Date Medicare IM Given:    Medicare IM give by:    Date Additional Medicare IM Given:    Additional Medicare Important Message give by:     If discussed at Long Length of Stay Meetings, dates discussed:    Additional Comments:  Cheryl FlashBlackwell, Diesha Rostad Crowder, RN 06/17/2015, 2:03 PM

## 2015-06-17 NOTE — Progress Notes (Addendum)
[facility] PROGRESS NOTE  Janet Whitaker ZOX:[id number] DOB: 15-Sep-1960 DOA: 06/16/2015 PCP: No primary care provider on file.    Code Status: Full code Family Communication: Discussed with husband Disposition Plan: Possible discharge in 1-2 days   Consultants:  None  Procedures:  None  Antibiotics:  None  HPI/Subjective: Patient says that her back pain is slightly less. She can move her legs and her arms particularly on the left little better, but she still has some weakness and some pain with movement.  Objective: Filed Vitals:   06/17/15 0100 06/17/15 0646  BP: 94/57 108/68  Pulse: 69 81  Temp: 97.6 F (36.4 C) 97.5 F (36.4 C)  Resp: 16 15   oxygen saturation 98% on room air.  Intake/Output Summary (Last 24 hours) at 06/17/15 1119 Last data filed at 06/17/15 8119  Gross per 24 hour  Intake    240 ml  Output      3 ml  Net    237 ml   Filed Weights   06/16/15 1101  Weight: 56.246 kg (124 lb)    Exam:   General:  Pleasant alert 54 year old Asian woman laying in bed, in no acute distress.  Cardiovascular: S1, S2, no murmurs rubs or gallops.  Respiratory: Clear to auscultation bilaterally.  Abdomen: Positive bowel sounds, soft, nontender, nondistended.  Musculoskeletal/extremities: No acute joints. Mild tenderness over the left and right trapezius muscles and mild tenderness over the paracervical muscles without significant spasm; no erythema or edema;  Left greater than right mild to moderate lumbosacral tenderness.  Neurologic: She is alert and oriented 3. Cranial nerves II through XII are intact. Strength of the upper extremities is 5 over 5. Sensation intact grossly of the upper extremities. Strength of the right lower extremity is 5 minus over 5 in strength of left lower extremity is 4+ to 5 minus over 5. Sensation of the lower extremities grossly intact to cold touch.  Data Reviewed: Basic Metabolic Panel:  Recent Labs Lab 06/16/15 1238  06/17/15 0550  NA 138 137  K 3.4* 3.6  CL 105 108  CO2 23 24  GLUCOSE 97 122*  BUN 13 17  CREATININE 0.63 0.61  CALCIUM 9.3 8.8*  MG 2.1  --    Liver Function Tests:  Recent Labs Lab 06/16/15 1238 06/17/15 0550  AST 19 20  ALT 14 16  ALKPHOS 47 48  BILITOT 0.8 0.9  PROT 7.6 6.9  ALBUMIN 4.2 3.7   No results for input(s): LIPASE, AMYLASE in the last 168 hours. No results for input(s): AMMONIA in the last 168 hours. CBC:  Recent Labs Lab 06/16/15 1238 06/17/15 0550  WBC 6.2 6.4  NEUTROABS 4.1  --   HGB 12.6 12.1  HCT 36.9 36.0  MCV 87.2 87.8  PLT 247 248   Cardiac Enzymes: No results for input(s): CKTOTAL, CKMB, CKMBINDEX, TROPONINI in the last 168 hours. BNP (last 3 results) No results for input(s): BNP in the last 8760 hours.  ProBNP (last 3 results) No results for input(s): PROBNP in the last 8760 hours.  CBG: No results for input(s): GLUCAP in the last 168 hours.  No results found for this or any previous visit (from the past 240 hour(s)).   Studies: Dg Chest 1 View  06/16/2015  CLINICAL DATA:  Low back pain for 9 days extends through chest into neck EXAM: CHEST 1 VIEW COMPARISON:  None. FINDINGS: The heart size and mediastinal contours are within normal limits. Both lungs are clear. The visualized skeletal  structures are unremarkable. IMPRESSION: No active disease. Electronically Signed   By: Esperanza Heir M.D.   On: 06/16/2015 17:09   Ct Cervical Spine Wo Contrast  06/16/2015  CLINICAL DATA:  Neck pain after standing from floor. Recent chiropractor visit for back pain. EXAM: CT CERVICAL SPINE WITHOUT CONTRAST TECHNIQUE: Multidetector CT imaging of the cervical spine was performed without intravenous contrast. Multiplanar CT image reconstructions were also generated. COMPARISON:  None. FINDINGS: Ossification of the posterior longitudinal ligament extending from the C7 level down to the lower T1 level as shown on images 31-32 of series 5. 2 mm posterior  subluxation at C5-6 thought to be degenerative. Uncinate spurring, mild facet arthropathy, and possible degenerative disc disease in the right neural foramen at C5-6 a potential cause for right foraminal impingement. There is also some left uncinate spurring at C5-6 and C6-7 without overt osseous foraminal stenosis at these levels. No prevertebral soft tissue swelling. I suspect small central disc protrusions at C2-3 and C3-4 and there is potentially mild central narrowing of the thecal sac at C5-6 due to disc bulge. Biapical pleural parenchymal scarring appears relatively symmetric. Upper normal size bilateral station 2 lymph nodes including an 8 mm short axis lymph node on image 35 series 3. IMPRESSION: 1. Cervical spondylosis and degenerative disc disease with potential impingement at C5-6. 2. 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 3. 2 mm degenerative posterior subluxation at C5-6. 4. In the lungs, there is biapical pleural parenchymal scarring which appear symmetric. 5. Small central disc protrusions at C2- 3 and C3-4 with borderline associated central narrowing of the thecal sac. Electronically Signed   By: Gaylyn Rong M.D.   On: 06/16/2015 13:05   Ct Lumbar Spine Wo Contrast  06/16/2015  CLINICAL DATA:  Low back pain for 9 days, difficulty standing or moving. EXAM: CT LUMBAR SPINE WITHOUT CONTRAST TECHNIQUE: Multidetector CT imaging of the lumbar spine was performed without intravenous contrast administration. Multiplanar CT image reconstructions were also generated. COMPARISON:  None. FINDINGS: The lowest lumbar type non-rib-bearing vertebra is labeled as L5. No lumbar spine fracture or malalignment is observed. The visualized abdominal contents unremarkable on noncontrast CT appearance, including a normal-appearing appendix. Additional findings at individual levels are as follows: L1-2: Unremarkable. L2-3:  Unremarkable. L3-4: Left lateral recess disc extrusion is  suspected to extend caudad and probably causes impingement on the left L4 nerve roots. Left eccentric disc bulge with borderline central narrowing of the thecal sac. L4-5: Borderline central narrowing of the thecal sac and borderline bilateral subarticular lateral recess stenosis due to disc bulge. L5-S1:  No impingement seen.  Mild disc bulge. IMPRESSION: 1. At L3-4, I suspect a left lateral recess disc extrusion extending caudad and likely impinging on the left L4 nerve roots. 2. Borderline impingement at L4-5 due to degenerative disc disease. 3. No fracture or malalignment identified. Electronically Signed   By: Gaylyn Rong M.D.   On: 06/16/2015 13:11   Mr Cervical Spine W Wo Contrast  06/16/2015  CLINICAL DATA:  Severe neck and low back pain for the past 9 days. Unable to walk. EXAM: MRI CERVICAL SPINE WITHOUT AND WITH CONTRAST TECHNIQUE: Multiplanar and multiecho pulse sequences of the cervical spine, to include the craniocervical junction and cervicothoracic junction, were obtained according to standard protocol without and with intravenous contrast. CONTRAST:  10mL MULTIHANCE GADOBENATE DIMEGLUMINE 529 MG/ML IV SOLN COMPARISON:  Cervical spine CT 06/16/2015 FINDINGS: Trace retrolisthesis is noted from C3-4 to C6-7. Vertebral body heights are  preserved. No significant vertebral marrow edema is seen. Craniocervical junction is unremarkable. No abnormal enhancement is identified. Cervical spinal cord is normal in caliber and signal. Paraspinal soft tissues are unremarkable. C2-3:  Tiny central disc protrusion without stenosis. C3-4:  Minimal disc bulging without stenosis. C4-5:  Negative. C5-6: Small right foraminal disc extrusion results in moderate right neural foraminal stenosis, potentially affecting the right C6 nerve. Mild disc bulging, uncovertebral spurring, and infolding of the ligamentum flavum result in mild to moderate spinal stenosis and mild left neural foraminal stenosis. C6-7: Minimal  disc bulging and uncovertebral spurring result in minimal bilateral neural foraminal narrowing without spinal stenosis. C7-T1:  Negative. IMPRESSION: 1. 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 2. Right foraminal disc extrusion at C5-6 resulting in moderate foraminal stenosis. Electronically Signed   By: Sebastian Ache M.D.   On: 06/16/2015 20:20   Mr Lumbar Spine W Wo Contrast  06/16/2015  CLINICAL DATA:  Severe neck and low back pain for the past 9 days. Unable to walk. EXAM: MRI LUMBAR SPINE WITHOUT AND WITH CONTRAST TECHNIQUE: Multiplanar and multiecho pulse sequences of the lumbar spine were obtained without and with intravenous contrast. CONTRAST:  10mL MULTIHANCE GADOBENATE DIMEGLUMINE 529 MG/ML IV SOLN COMPARISON:  Lumbar spine CT 06/16/2015 FINDINGS: Lowest fully formed intervertebral disc space is labeled L5-S1 as on earlier CT. Trace retrolisthesis of L3 on L4 is unchanged. Vertebral body heights are preserved. There is disc desiccation from L3-4 to L5-S1 with mild disc space height loss at L3-4. No vertebral marrow edema or abnormal enhancement is identified. Conus medullaris is normal in signal and terminates at T12-L1. Paraspinal soft tissues are unremarkable. L1-2 and L2-3:  Negative. L3-4: Small left paracentral disc extrusion with mild caudal migration in the left lateral recess resulting in moderate left lateral recess stenosis and left L4 nerve root impingement. Minimal circumferential disc bulging and facet hypertrophy without spinal canal or neural foraminal stenosis. L4-5: Shallow central disc protrusion with annular fissure, mild circumferential disc bulging, and mild facet hypertrophy result in minimal bilateral lateral recess and minimal bilateral neural foraminal narrowing without spinal stenosis. L5-S1: Small central annular fissure without disc herniation or stenosis. IMPRESSION: 1. Small L3-4 disc extrusion affecting the left L4 nerve root in the lateral recess. 2. Mild  disc bulging at L4-5 without evidence of significant stenosis. Electronically Signed   By: Sebastian Ache M.D.   On: 06/16/2015 20:26    Scheduled Meds: . enoxaparin (LOVENOX) injection  40 mg Subcutaneous Q24H  . methylPREDNISolone (SOLU-MEDROL) injection  80 mg Intravenous Q12H  . sodium chloride  3 mL Intravenous Q12H   Continuous Infusions:   Assessment and plan:  Principal Problem:   Intractable back pain Active Problems:   Cervical radiculopathy at C6   Left lumbar radiculopathy    1. Intractable low back pain with evidence of C5-C6 cervical radiculopathy/mild to moderate and L3-L4 moderate left greater than right spinal stenosis and L4 nerve impingement. On admission, the patient presented with intractable back pain and shoulder pain. In the ED, CT of her cervical spine revealed a number of changes that were concerning. Admitting physician, Dr. Susie Cassette discussed the cervical spine CT with neurosurgeon on call, Dr. Bevely Palmer. He recommended further imaging with MRI of her cervical and lumbar spine. He recommended steroids and muscle relaxants. -MRI of her cervical spine revealed a normal spinal cord, mild to moderate multifactorial spinal stenosis at C5-C6, and right foraminal disc extrusion at C5-C6 resulting in moderate foraminal stenosis.  -MRI of her  lumbar spine revealed small L3-L4 disc extrusion affecting the left L4 nerve root in the lateral recess and mild bulging at L4-L5 without significant stenosis. -Patient has improved some over the last 12 hours on IV steroids.  -Will decrease IV Solu-Medrol to 60 mg every 24 hours. Will change Robaxin from when necessary to scheduled 3 times a day. Will continue as needed opiate analgesics.  -Will consult physical therapy for evaluation and management.  -Will refer patient for outpatient neurosurgery follow-up, but I do not believe she needs inpatient intervention currently.     Time spent: 35 minutes.    [facility] HospitalFISHER,Janet Longanecker  [facility] Pager [phone]. If 7PM-7AM, please contact night-coverage at [email or web address], password [email or web address] 06/17/2015, 11:19 AM  LOS: 1 day

## 2015-06-18 ENCOUNTER — Telehealth (HOSPITAL_COMMUNITY): Payer: Self-pay | Admitting: Physical Therapy

## 2015-06-18 DIAGNOSIS — M5136 Other intervertebral disc degeneration, lumbar region: Secondary | ICD-10-CM

## 2015-06-18 DIAGNOSIS — R531 Weakness: Secondary | ICD-10-CM

## 2015-06-18 DIAGNOSIS — R262 Difficulty in walking, not elsewhere classified: Secondary | ICD-10-CM | POA: Insufficient documentation

## 2015-06-18 MED ORDER — PREDNISONE 10 MG PO TABS: ORAL_TABLET | ORAL | Status: AC

## 2015-06-18 MED ORDER — METHOCARBAMOL 500 MG PO TABS: ORAL_TABLET | ORAL | Status: AC

## 2015-06-18 MED ORDER — OXYCODONE-ACETAMINOPHEN 2.5-325 MG PO TABS: 1.0000 | ORAL_TABLET | ORAL | Status: AC | PRN

## 2015-06-18 NOTE — Progress Notes (Signed)
Instructions given on discharge medications,and follow up visits,patient,and husband verbalized understanding.Prescriptions sent with patient.Vital signs stable. Accompanied by staff to an awaiting vehicle.

## 2015-06-18 NOTE — Care Management Note (Signed)
Case Management Note  Patient Details  Name: Janet MayerJin Fernholz MRN: [id number] Date of Birth: 18-Nov-1960  Subjective/Objective:                    Action/Plan:   Expected Discharge Date:                  Expected Discharge Plan:  Home w Home Health Services  In-House Referral:  NA  Discharge planning Services  CM Consult  Post Acute Care Choice:  Home Health Choice offered to:  Patient, Spouse  DME Arranged:    DME Agency:     HH Arranged:    HH Agency:     Status of Service:  Completed, signed off  Medicare Important Message Given:    Date Medicare IM Given:    Medicare IM give by:    Date Additional Medicare IM Given:    Additional Medicare Important Message give by:     If discussed at Long Length of Stay Meetings, dates discussed:    Additional Comments: Pt discharged home with oupt PT arrangements with [facility] dept (per Pt preference). Appt made and documented on AVS and pt and pts husband aware of appt time. Pt will also be placed on wait list for an earlier appt and pt will be called if time becomes available. No DME needs noted. Pt and pts nurse aware of discharge arrangements. Arlyss QueenBlackwell, Willo Yoon Moclipsrowder, RN 06/18/2015, 10:14 AM

## 2015-06-18 NOTE — Discharge Summary (Signed)
Physician Discharge Summary  Janet Whitaker ZOX:[id number] DOB: 09-13-1960 DOA: 06/16/2015  PCP: No primary care provider on file.  Admit date: 06/16/2015 Discharge date: 06/18/2015  Time spent: Greater than 30 minutes  Recommendations for Outpatient Follow-up:  1. Patient will follow-up with neurosurgery as scheduled.  2. Outpatient physical therapy will be scheduled.  Discharge Diagnoses:  1. Intractable low back pain with the inability to ambulate on admission, secondary to cervical and lumbar radiculopathy and spinal stenosis.   Discharge Condition: Improved.  Diet recommendation: Regular.  Filed Weights   06/16/15 1101  Weight: 56.246 kg (124 lb)    History of present illness:  The patient is a 54 year old woman with no significant past medical history, who presented to the emergency department on 06/16/2015 with a chief complaint of intractable back pain, left greater than right leg weakness, and the inability to ambulate. In the ED, she was afebrile and hemodynamically stable. CT of the cervical spine and lumbar spine revealed cervical spondylosis and degenerative changes with potential impingement at C5-C6, disc protrusions at C2-C3 and C3-C4 with borderline associated central narrowing of the thecal sac. At L3-4, left lateral recess disc extrusion extending caudate and likely impinging the left L4 nerve root. Borderline impingement at L4-L5 due to degenerative disc disease. The patient was admitted for further evaluation and management.  Hospital Course:  Hospital Course:  1. Intractable low back pain with evidence of C5-C6 cervical radiculopathy/mild to moderate and L3-L4 moderate left greater than right spinal stenosis and L4 nerve impingement. On admission, the patient presented with intractable back pain, shoulder discomfort, and subjective left greater than right leg weakness. In the ED, CT of her cervical and lumbar spine revealed a number of changes that were concerning.  Admitting physician, Dr. Susie Cassette discussed the cervical and lumbar spine CT results with neurosurgeon on call, Dr. Bevely Palmer. He recommended further imaging with MRI of her cervical and lumbar spine. He recommended steroids and muscle relaxants. -MRI of her cervical spine revealed a normal spinal cord, mild to moderate multifactorial spinal stenosis at C5-C6, and right foraminal disc extrusion at C5-C6 resulting in moderate foraminal stenosis.  -MRI of her lumbar spine revealed small L3-L4 disc extrusion affecting the left L4 nerve root in the lateral recess and mild bulging at L4-L5 without significant stenosis. -Patient was started on IV Solu-Medrol every 12 hours, Robaxin 3 times a day, and as needed opiate analgesics. -The physical therapist was consulted. Following his evaluation, the therapist recommended further outpatient therapy. The patient's information was faxed to the outpatient physical therapy department and the plan is for them to call her for further outpatient therapy. -The patient was also given a scheduled appointment to follow-up with neurosurgeon, Dr. Bevely Palmer for further evaluation and management. -The patient clearly improved clinically as she was able to ambulate unassisted prior to hospital discharge. -She was instructed to avoid heavy lifting. She was discharged on a prednisone taper, Robaxin, and as needed Percocet.  Procedures:  None  Consultations:  None  Discharge Exam: Filed Vitals:   06/17/15 2250 06/18/15 0636  BP: 120/73 111/70  Pulse: 63 61  Temp: 98 F (36.7 C) 98 F (36.7 C)  Resp: 20 20  General: Pleasant 54 year old Asian woman sitting up in a chair, in no acute distress. Heart: S1, S2, no murmurs rubs or gallops. Respiratory: Clear to auscultation bilaterally. Abdomen: Positive bowel sounds, soft, nontender, nondistended. Musculoskeletal/neurologic: No acute joint. She is alert and oriented 3. Cranial nerves II through XII are intact. She is now  standing without assistance. She is able to ambulate to the room door and back to the chair slowly, but without assistance. Sensation of her extremities is grossly intact to cold touch.  Discharge Instructions   Discharge Instructions    Diet general    Complete by:  As directed      Discharge instructions    Complete by:  As directed   Avoid heavy lifting.     Increase activity slowly    Complete by:  As directed           Discharge Medication List as of 06/18/2015 12:44 PM    START taking these medications   Details  methocarbamol (ROBAXIN) 500 MG tablet Take 1 tablet 2 times daily for 6 days and then 2 times daily as needed thereafter for pain and muscle spasms., Print    oxycodone-acetaminophen (PERCOCET) 2.5-325 MG tablet Take 1 tablet by mouth every 4 (four) hours as needed for pain., Starting 06/18/2015, Until Discontinued, Print    predniSONE (DELTASONE) 10 MG tablet Starting 06/19/15, take 6 tablets daily for 1 day; then 5 tablets daily for 1 day; then 4 tablets daily for 1 day; then 3 tablets daily for 1 day; then 2 tablets daily for 1 day; then 1 tablet daily for 1 day; then stop., Print      CONTINUE these medications which have NOT CHANGED   Details  vitamin B-12 (CYANOCOBALAMIN) 1000 MCG tablet Take 1,000 mcg by mouth daily., Until Discontinued, Historical Med       No Known Allergies Follow-up Information    Follow up with Truitt Merle, MD On 06/23/2015.   Specialty:  Neurosurgery   Why:  AT 12:45 PM; BE THERE AT 12:30 PM   Contact information:   [address] [place] [address] [phone]       Follow up On 06/24/2015.   Why:  at 8:00   Contact information:   [facility] [address] [place], [place] [address] [phone]        The results of significant diagnostics from this hospitalization (including imaging, microbiology, ancillary and laboratory) are listed below for reference.     Significant Diagnostic Studies: Dg Chest 1 View  06/16/2015  CLINICAL DATA:  Low back pain for 9 days extends through chest into neck EXAM: CHEST 1 VIEW COMPARISON:  None. FINDINGS: The heart size and mediastinal contours are within normal limits. Both lungs are clear. The visualized skeletal structures are unremarkable. IMPRESSION: No active disease. Electronically Signed   By: Esperanza Heir M.D.   On: 06/16/2015 17:09   Ct Cervical Spine Wo Contrast  06/16/2015  CLINICAL DATA:  Neck pain after standing from floor. Recent chiropractor visit for back pain. EXAM: CT CERVICAL SPINE WITHOUT CONTRAST TECHNIQUE: Multidetector CT imaging of the cervical spine was performed without intravenous contrast. Multiplanar CT image reconstructions were also generated. COMPARISON:  None. FINDINGS: Ossification of the posterior longitudinal ligament extending from the C7 level down to the lower T1 level as shown on images 31-32 of series 5. 2 mm posterior subluxation at C5-6 thought to be degenerative. Uncinate spurring, mild facet arthropathy, and possible degenerative disc disease in the right neural foramen at C5-6 a potential cause for right foraminal impingement. There is also some left uncinate spurring at C5-6 and C6-7 without overt osseous foraminal stenosis at these levels. No prevertebral soft tissue swelling. I suspect small central disc protrusions at C2-3 and C3-4 and there is  potentially mild central narrowing of the thecal sac at C5-6 due to disc bulge. Biapical pleural parenchymal scarring appears relatively symmetric. Upper normal size bilateral station 2 lymph nodes including an 8 mm short axis lymph node on image 35 series 3. IMPRESSION: 1. Cervical spondylosis and degenerative disc disease with potential impingement at C5-6. 2. 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 3. 2 mm degenerative posterior subluxation at C5-6. 4. In the lungs, there is biapical pleural parenchymal  scarring which appear symmetric. 5. Small central disc protrusions at C2- 3 and C3-4 with borderline associated central narrowing of the thecal sac. Electronically Signed   By: Gaylyn RongWalter  Liebkemann M.D.   On: 06/16/2015 13:05   Ct Lumbar Spine Wo Contrast  06/16/2015  CLINICAL DATA:  Low back pain for 9 days, difficulty standing or moving. EXAM: CT LUMBAR SPINE WITHOUT CONTRAST TECHNIQUE: Multidetector CT imaging of the lumbar spine was performed without intravenous contrast administration. Multiplanar CT image reconstructions were also generated. COMPARISON:  None. FINDINGS: The lowest lumbar type non-rib-bearing vertebra is labeled as L5. No lumbar spine fracture or malalignment is observed. The visualized abdominal contents unremarkable on noncontrast CT appearance, including a normal-appearing appendix. Additional findings at individual levels are as follows: L1-2: Unremarkable. L2-3:  Unremarkable. L3-4: Left lateral recess disc extrusion is suspected to extend caudad and probably causes impingement on the left L4 nerve roots. Left eccentric disc bulge with borderline central narrowing of the thecal sac. L4-5: Borderline central narrowing of the thecal sac and borderline bilateral subarticular lateral recess stenosis due to disc bulge. L5-S1:  No impingement seen.  Mild disc bulge. IMPRESSION: 1. At L3-4, I suspect a left lateral recess disc extrusion extending caudad and likely impinging on the left L4 nerve roots. 2. Borderline impingement at L4-5 due to degenerative disc disease. 3. No fracture or malalignment identified. Electronically Signed   By: Gaylyn RongWalter  Liebkemann M.D.   On: 06/16/2015 13:11   Mr Cervical Spine W Wo Contrast  06/16/2015  CLINICAL DATA:  Severe neck and low back pain for the past 9 days. Unable to walk. EXAM: MRI CERVICAL SPINE WITHOUT AND WITH CONTRAST TECHNIQUE: Multiplanar and multiecho pulse sequences of the cervical spine, to include the craniocervical junction and  cervicothoracic junction, were obtained according to standard protocol without and with intravenous contrast. CONTRAST:  10mL MULTIHANCE GADOBENATE DIMEGLUMINE 529 MG/ML IV SOLN COMPARISON:  Cervical spine CT 06/16/2015 FINDINGS: Trace retrolisthesis is noted from C3-4 to C6-7. Vertebral body heights are preserved. No significant vertebral marrow edema is seen. Craniocervical junction is unremarkable. No abnormal enhancement is identified. Cervical spinal cord is normal in caliber and signal. Paraspinal soft tissues are unremarkable. C2-3:  Tiny central disc protrusion without stenosis. C3-4:  Minimal disc bulging without stenosis. C4-5:  Negative. C5-6: Small right foraminal disc extrusion results in moderate right neural foraminal stenosis, potentially affecting the right C6 nerve. Mild disc bulging, uncovertebral spurring, and infolding of the ligamentum flavum result in mild to moderate spinal stenosis and mild left neural foraminal stenosis. C6-7: Minimal disc bulging and uncovertebral spurring result in minimal bilateral neural foraminal narrowing without spinal stenosis. C7-T1:  Negative. IMPRESSION: 1. 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 2. Right foraminal disc extrusion at C5-6 resulting in moderate foraminal stenosis. Electronically Signed   By: Sebastian AcheAllen  Grady M.D.   On: 06/16/2015 20:20   Mr Lumbar Spine W Wo Contrast  06/16/2015  CLINICAL DATA:  Severe neck and low back pain for the past 9 days. Unable  to walk. EXAM: MRI LUMBAR SPINE WITHOUT AND WITH CONTRAST TECHNIQUE: Multiplanar and multiecho pulse sequences of the lumbar spine were obtained without and with intravenous contrast. CONTRAST:  10mL MULTIHANCE GADOBENATE DIMEGLUMINE 529 MG/ML IV SOLN COMPARISON:  Lumbar spine CT 06/16/2015 FINDINGS: Lowest fully formed intervertebral disc space is labeled L5-S1 as on earlier CT. Trace retrolisthesis of L3 on L4 is unchanged. Vertebral body heights are preserved. There is disc  desiccation from L3-4 to L5-S1 with mild disc space height loss at L3-4. No vertebral marrow edema or abnormal enhancement is identified. Conus medullaris is normal in signal and terminates at T12-L1. Paraspinal soft tissues are unremarkable. L1-2 and L2-3:  Negative. L3-4: Small left paracentral disc extrusion with mild caudal migration in the left lateral recess resulting in moderate left lateral recess stenosis and left L4 nerve root impingement. Minimal circumferential disc bulging and facet hypertrophy without spinal canal or neural foraminal stenosis. L4-5: Shallow central disc protrusion with annular fissure, mild circumferential disc bulging, and mild facet hypertrophy result in minimal bilateral lateral recess and minimal bilateral neural foraminal narrowing without spinal stenosis. L5-S1: Small central annular fissure without disc herniation or stenosis. IMPRESSION: 1. Small L3-4 disc extrusion affecting the left L4 nerve root in the lateral recess. 2. Mild disc bulging at L4-5 without evidence of significant stenosis. Electronically Signed   By: Sebastian Ache M.D.   On: 06/16/2015 20:26    Microbiology: No results found for this or any previous visit (from the past 240 hour(s)).   Labs: Basic Metabolic Panel:  Recent Labs Lab 06/16/15 1238 06/17/15 0550  NA 138 137  K 3.4* 3.6  CL 105 108  CO2 23 24  GLUCOSE 97 122*  BUN 13 17  CREATININE 0.63 0.61  CALCIUM 9.3 8.8*  MG 2.1  --    Liver Function Tests:  Recent Labs Lab 06/16/15 1238 06/17/15 0550  AST 19 20  ALT 14 16  ALKPHOS 47 48  BILITOT 0.8 0.9  PROT 7.6 6.9  ALBUMIN 4.2 3.7   No results for input(s): LIPASE, AMYLASE in the last 168 hours. No results for input(s): AMMONIA in the last 168 hours. CBC:  Recent Labs Lab 06/16/15 1238 06/17/15 0550  WBC 6.2 6.4  NEUTROABS 4.1  --   HGB 12.6 12.1  HCT 36.9 36.0  MCV 87.2 87.8  PLT 247 248   Cardiac Enzymes: No results for input(s): CKTOTAL, CKMB,  CKMBINDEX, TROPONINI in the last 168 hours. BNP: BNP (last 3 results) No results for input(s): BNP in the last 8760 hours.  ProBNP (last 3 results) No results for input(s): PROBNP in the last 8760 hours.  CBG: No results for input(s): GLUCAP in the last 168 hours.     Signed:  Lulie Hurd  [facility] 06/18/2015, 5:40 PM

## 2015-06-18 NOTE — Progress Notes (Deleted)
Physician Discharge Summary  Janet Whitaker ZOX:[id number] DOB: [birth year] DOA: 06/16/2015  PCP: No primary care provider on file.  Admit date: 06/16/2015 Discharge date: 06/18/2015  Time spent: Greater than 30 minutes  Recommendations for Outpatient Follow-up:  1. Patient will follow-up with neurosurgery as scheduled.  2. Outpatient physical therapy will be scheduled.   Discharge Diagnoses:  1. Intractable low back pain with the inability to ambulate on admission, secondary to cervical and lumbar radiculopathy and spinal stenosis.   Discharge Condition: Improved.  Diet recommendation: Regular.  Filed Weights   06/16/15 1101  Weight: 56.246 kg (124 lb)    History of present illness:  The patient is a 54 year old woman with no significant past medical history, who presented to the emergency department on 06/16/2015 with a chief complaint of intractable back pain, left greater than right leg weakness, and the inability to ambulate. In the ED, she was afebrile and hemodynamically stable. CT of the cervical spine and lumbar spine revealed cervical spondylosis and degenerative changes with potential impingement at C5-C6, disc protrusions at C2-C3 and C3-C4 with borderline associated central narrowing of the thecal sac. At L3-4, left lateral recess disc extrusion extending caudate and likely impinging the left L4 nerve root. Borderline impingement at L4-L5 due to degenerative disc disease. The patient was admitted for further evaluation and management.   Hospital Course:  1. Intractable low back pain with evidence of C5-C6 cervical radiculopathy/mild to moderate and L3-L4 moderate left greater than right spinal stenosis and L4 nerve impingement. On admission, the patient presented with intractable back pain, shoulder discomfort, and subjective left greater than right leg weakness. In the ED, CT of her cervical and lumbar spine revealed a number of changes that were concerning. Admitting physician,  Dr. Susie Cassette discussed the cervical and lumbar spine CT results with neurosurgeon on call, Dr. Bevely Palmer. He recommended further imaging with MRI of her cervical and lumbar spine. He recommended steroids and muscle relaxants. -MRI of her cervical spine revealed a normal spinal cord, mild to moderate multifactorial spinal stenosis at C5-C6, and right foraminal disc extrusion at C5-C6 resulting in moderate foraminal stenosis.  -MRI of her lumbar spine revealed small L3-L4 disc extrusion affecting the left L4 nerve root in the lateral recess and mild bulging at L4-L5 without significant stenosis. -Patient was started on IV Solu-Medrol every 12 hours, Robaxin 3 times a day, and as needed opiate analgesics. -The physical therapist was consulted. Following his consultation, he recommended further outpatient therapy. The patient's information was faxed to the outpatient physical therapy department and the plan is for them to call her for further outpatient therapy. -The patient was also given a scheduled appointment to follow-up with neurosurgeon, Dr. Bevely Palmer for further evaluation and management. -The patient clearly improved as she was able to ambulate unassisted prior to hospital discharge. -She was instructed to avoid heavy lifting. She was discharged on a prednisone taper, Robaxin, and as needed Percocet.  Procedures:  None  Consultations:  None  Discharge Exam: Filed Vitals:   06/17/15 2250 06/18/15 0636  BP: 120/73 111/70  Pulse: 63 61  Temp: 98 F (36.7 C) 98 F (36.7 C)  Resp: 20 20    General: Pleasant 54 year old Asian woman sitting up in a chair, in no acute distress. Heart: S1, S2, no murmurs rubs or gallops. Respiratory: Clear to auscultation bilaterally. Abdomen: Positive bowel sounds, soft, nontender, nondistended. Musculoskeletal/neurologic: No acute joint. She is alert and oriented 3. Cranial nerves II through XII are intact. She is now standing  without assistance. She is able to  ambulate to the room door and back to the chair slowly, but without assistance. Sensation of her extremities is grossly intact with cold touch.   Discharge Instructions   Discharge Instructions    Diet general    Complete by:  As directed      Discharge instructions    Complete by:  As directed   Avoid heavy lifting.     Increase activity slowly    Complete by:  As directed           Current Discharge Medication List    START taking these medications   Details  methocarbamol (ROBAXIN) 500 MG tablet Take 1 tablet 2 times daily for 6 days and then 2 times daily as needed thereafter for pain and muscle spasms. Qty: 30 tablet, Refills: 1    oxycodone-acetaminophen (PERCOCET) 2.5-325 MG tablet Take 1 tablet by mouth every 4 (four) hours as needed for pain. Qty: 30 tablet, Refills: 0    predniSONE (DELTASONE) 10 MG tablet Starting 06/19/15, take 6 tablets daily for 1 day; then 5 tablets daily for 1 day; then 4 tablets daily for 1 day; then 3 tablets daily for 1 day; then 2 tablets daily for 1 day; then 1 tablet daily for 1 day; then stop. Qty: 21 tablet, Refills: 0      CONTINUE these medications which have NOT CHANGED   Details  vitamin B-12 (CYANOCOBALAMIN) 1000 MCG tablet Take 1,000 mcg by mouth daily.       No Known Allergies Follow-up Information    Follow up with Janet Merle, MD On 06/23/2015.   Specialty:  Neurosurgery   Why:  AT 12:45 PM; BE THERE AT 12:30 PM   Contact information:   [address] [place] [address] [phone]        The results of significant diagnostics from this hospitalization (including imaging, microbiology, ancillary and laboratory) are listed below for reference.    Significant Diagnostic Studies: Dg Chest 1 View  06/16/2015  CLINICAL DATA:  Low back pain for 9 days extends through chest into neck EXAM: CHEST 1 VIEW COMPARISON:  None. FINDINGS: The heart size and mediastinal contours are within normal limits.  Both lungs are clear. The visualized skeletal structures are unremarkable. IMPRESSION: No active disease. Electronically Signed   By: Esperanza Heir M.D.   On: 06/16/2015 17:09   Ct Cervical Spine Wo Contrast  06/16/2015  CLINICAL DATA:  Neck pain after standing from floor. Recent chiropractor visit for back pain. EXAM: CT CERVICAL SPINE WITHOUT CONTRAST TECHNIQUE: Multidetector CT imaging of the cervical spine was performed without intravenous contrast. Multiplanar CT image reconstructions were also generated. COMPARISON:  None. FINDINGS: Ossification of the posterior longitudinal ligament extending from the C7 level down to the lower T1 level as shown on images 31-32 of series 5. 2 mm posterior subluxation at C5-6 thought to be degenerative. Uncinate spurring, mild facet arthropathy, and possible degenerative disc disease in the right neural foramen at C5-6 a potential cause for right foraminal impingement. There is also some left uncinate spurring at C5-6 and C6-7 without overt osseous foraminal stenosis at these levels. No prevertebral soft tissue swelling. I suspect small central disc protrusions at C2-3 and C3-4 and there is potentially mild central narrowing of the thecal sac at C5-6 due to disc bulge. Biapical pleural parenchymal scarring appears relatively symmetric. Upper normal size bilateral station 2 lymph nodes including an 8 mm short axis lymph  node on image 35 series 3. IMPRESSION: 1. Cervical spondylosis and degenerative disc disease with potential impingement at C5-6. 2. 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 3. 2 mm degenerative posterior subluxation at C5-6. 4. In the lungs, there is biapical pleural parenchymal scarring which appear symmetric. 5. Small central disc protrusions at C2- 3 and C3-4 with borderline associated central narrowing of the thecal sac. Electronically Signed   By: Gaylyn Rong M.D.   On: 06/16/2015 13:05   Ct Lumbar Spine Wo  Contrast  06/16/2015  CLINICAL DATA:  Low back pain for 9 days, difficulty standing or moving. EXAM: CT LUMBAR SPINE WITHOUT CONTRAST TECHNIQUE: Multidetector CT imaging of the lumbar spine was performed without intravenous contrast administration. Multiplanar CT image reconstructions were also generated. COMPARISON:  None. FINDINGS: The lowest lumbar type non-rib-bearing vertebra is labeled as L5. No lumbar spine fracture or malalignment is observed. The visualized abdominal contents unremarkable on noncontrast CT appearance, including a normal-appearing appendix. Additional findings at individual levels are as follows: L1-2: Unremarkable. L2-3:  Unremarkable. L3-4: Left lateral recess disc extrusion is suspected to extend caudad and probably causes impingement on the left L4 nerve roots. Left eccentric disc bulge with borderline central narrowing of the thecal sac. L4-5: Borderline central narrowing of the thecal sac and borderline bilateral subarticular lateral recess stenosis due to disc bulge. L5-S1:  No impingement seen.  Mild disc bulge. IMPRESSION: 1. At L3-4, I suspect a left lateral recess disc extrusion extending caudad and likely impinging on the left L4 nerve roots. 2. Borderline impingement at L4-5 due to degenerative disc disease. 3. No fracture or malalignment identified. Electronically Signed   By: Gaylyn Rong M.D.   On: 06/16/2015 13:11   Mr Cervical Spine W Wo Contrast  06/16/2015  CLINICAL DATA:  Severe neck and low back pain for the past 9 days. Unable to walk. EXAM: MRI CERVICAL SPINE WITHOUT AND WITH CONTRAST TECHNIQUE: Multiplanar and multiecho pulse sequences of the cervical spine, to include the craniocervical junction and cervicothoracic junction, were obtained according to standard protocol without and with intravenous contrast. CONTRAST:  10mL MULTIHANCE GADOBENATE DIMEGLUMINE 529 MG/ML IV SOLN COMPARISON:  Cervical spine CT 06/16/2015 FINDINGS: Trace retrolisthesis is noted  from C3-4 to C6-7. Vertebral body heights are preserved. No significant vertebral marrow edema is seen. Craniocervical junction is unremarkable. No abnormal enhancement is identified. Cervical spinal cord is normal in caliber and signal. Paraspinal soft tissues are unremarkable. C2-3:  Tiny central disc protrusion without stenosis. C3-4:  Minimal disc bulging without stenosis. C4-5:  Negative. C5-6: Small right foraminal disc extrusion results in moderate right neural foraminal stenosis, potentially affecting the right C6 nerve. Mild disc bulging, uncovertebral spurring, and infolding of the ligamentum flavum result in mild to moderate spinal stenosis and mild left neural foraminal stenosis. C6-7: Minimal disc bulging and uncovertebral spurring result in minimal bilateral neural foraminal narrowing without spinal stenosis. C7-T1:  Negative. IMPRESSION: 1. 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 2. Right foraminal disc extrusion at C5-6 resulting in moderate foraminal stenosis. Electronically Signed   By: Sebastian Ache M.D.   On: 06/16/2015 20:20   Mr Lumbar Spine W Wo Contrast  06/16/2015  CLINICAL DATA:  Severe neck and low back pain for the past 9 days. Unable to walk. EXAM: MRI LUMBAR SPINE WITHOUT AND WITH CONTRAST TECHNIQUE: Multiplanar and multiecho pulse sequences of the lumbar spine were obtained without and with intravenous contrast. CONTRAST:  10mL MULTIHANCE GADOBENATE DIMEGLUMINE 529 MG/ML IV  SOLN COMPARISON:  Lumbar spine CT 06/16/2015 FINDINGS: Lowest fully formed intervertebral disc space is labeled L5-S1 as on earlier CT. Trace retrolisthesis of L3 on L4 is unchanged. Vertebral body heights are preserved. There is disc desiccation from L3-4 to L5-S1 with mild disc space height loss at L3-4. No vertebral marrow edema or abnormal enhancement is identified. Conus medullaris is normal in signal and terminates at T12-L1. Paraspinal soft tissues are unremarkable. L1-2 and L2-3:   Negative. L3-4: Small left paracentral disc extrusion with mild caudal migration in the left lateral recess resulting in moderate left lateral recess stenosis and left L4 nerve root impingement. Minimal circumferential disc bulging and facet hypertrophy without spinal canal or neural foraminal stenosis. L4-5: Shallow central disc protrusion with annular fissure, mild circumferential disc bulging, and mild facet hypertrophy result in minimal bilateral lateral recess and minimal bilateral neural foraminal narrowing without spinal stenosis. L5-S1: Small central annular fissure without disc herniation or stenosis. IMPRESSION: 1. Small L3-4 disc extrusion affecting the left L4 nerve root in the lateral recess. 2. Mild disc bulging at L4-5 without evidence of significant stenosis. Electronically Signed   By: Sebastian AcheAllen  Grady M.D.   On: 06/16/2015 20:26    Microbiology: No results found for this or any previous visit (from the past 240 hour(s)).   Labs: Basic Metabolic Panel:  Recent Labs Lab 06/16/15 1238 06/17/15 0550  NA 138 137  K 3.4* 3.6  CL 105 108  CO2 23 24  GLUCOSE 97 122*  BUN 13 17  CREATININE 0.63 0.61  CALCIUM 9.3 8.8*  MG 2.1  --    Liver Function Tests:  Recent Labs Lab 06/16/15 1238 06/17/15 0550  AST 19 20  ALT 14 16  ALKPHOS 47 48  BILITOT 0.8 0.9  PROT 7.6 6.9  ALBUMIN 4.2 3.7   No results for input(s): LIPASE, AMYLASE in the last 168 hours. No results for input(s): AMMONIA in the last 168 hours. CBC:  Recent Labs Lab 06/16/15 1238 06/17/15 0550  WBC 6.2 6.4  NEUTROABS 4.1  --   HGB 12.6 12.1  HCT 36.9 36.0  MCV 87.2 87.8  PLT 247 248   Cardiac Enzymes: No results for input(s): CKTOTAL, CKMB, CKMBINDEX, TROPONINI in the last 168 hours. BNP: BNP (last 3 results) No results for input(s): BNP in the last 8760 hours.  ProBNP (last 3 results) No results for input(s): PROBNP in the last 8760 hours.  CBG: No results for input(s): GLUCAP in the last 168  hours.     Signed:  Yvett Rossel  [facility] 06/18/2015, 9:47 AM

## 2015-06-18 NOTE — Telephone Encounter (Signed)
Patient got in with Neuro Surgeon and will come to see Freida BusmanAllen on Thursday for Eval. NF 06/18/2015

## 2015-06-22 ENCOUNTER — Ambulatory Visit (HOSPITAL_COMMUNITY): Payer: BLUE CROSS/BLUE SHIELD | Admitting: Physical Therapy

## 2015-06-24 ENCOUNTER — Ambulatory Visit (HOSPITAL_COMMUNITY): Payer: BLUE CROSS/BLUE SHIELD | Admitting: Physical Therapy

## 2015-06-25 ENCOUNTER — Ambulatory Visit (HOSPITAL_COMMUNITY): Payer: BLUE CROSS/BLUE SHIELD | Attending: Internal Medicine

## 2015-06-25 DIAGNOSIS — M7582 Other shoulder lesions, left shoulder: Secondary | ICD-10-CM | POA: Diagnosis present

## 2015-06-25 DIAGNOSIS — M25612 Stiffness of left shoulder, not elsewhere classified: Secondary | ICD-10-CM

## 2015-06-25 DIAGNOSIS — M545 Low back pain, unspecified: Secondary | ICD-10-CM

## 2015-06-25 DIAGNOSIS — M2560 Stiffness of unspecified joint, not elsewhere classified: Secondary | ICD-10-CM | POA: Insufficient documentation

## 2015-06-25 DIAGNOSIS — M62830 Muscle spasm of back: Secondary | ICD-10-CM | POA: Insufficient documentation

## 2015-06-25 DIAGNOSIS — R293 Abnormal posture: Secondary | ICD-10-CM | POA: Insufficient documentation

## 2015-06-25 DIAGNOSIS — M2569 Stiffness of other specified joint, not elsewhere classified: Secondary | ICD-10-CM

## 2015-06-25 DIAGNOSIS — M7581 Other shoulder lesions, right shoulder: Secondary | ICD-10-CM | POA: Insufficient documentation

## 2015-06-25 DIAGNOSIS — R52 Pain, unspecified: Secondary | ICD-10-CM | POA: Insufficient documentation

## 2015-06-25 DIAGNOSIS — M25611 Stiffness of right shoulder, not elsewhere classified: Secondary | ICD-10-CM

## 2015-06-26 NOTE — Therapy (Signed)
[facility] [address] [place], [place], [address] Phone: [phone]   Fax:  [phone]  Physical Therapy Evaluation  Patient Details  Name: Janet Whitaker MRN: [id number] Date of Birth: September 30, 1960 Referring Provider: Elliot Cousin  Encounter Date: 06/25/2015      PT End of Session - 06/26/15 0834    Visit Number 1   Number of Visits 6   Date for PT Re-Evaluation 07/26/15   Authorization Type BCBS   Authorization Time Period 06/25/15-08/25/14   Authorization - Visit Number 1   PT Start Time 1308   PT Stop Time 1411   PT Time Calculation (min) 63 min   Activity Tolerance Patient tolerated treatment well;Patient limited by pain   Behavior During Therapy [facility] for tasks assessed/performed      Past Medical History  Diagnosis Date  . Shoulder injury     Past Surgical History  Procedure Laterality Date  . Abdominal hysterectomy      There were no vitals filed for this visit.  Visit Diagnosis:  Left-sided low back pain without sciatica - Plan: PT plan of care cert/re-cert  Back stiffness - Plan: PT plan of care cert/re-cert  Spasm of back muscles - Plan: PT plan of care cert/re-cert  Postural imbalance - Plan: PT plan of care cert/re-cert  Decreased shoulder mobility, left - Plan: PT plan of care cert/re-cert  Decreased shoulder mobility, right - Plan: PT plan of care cert/re-cert  Pain aggravated by sitting - Plan: PT plan of care cert/re-cert  Pain aggravated by standing - Plan: PT plan of care cert/re-cert      Subjective Assessment - 06/25/15 1311    Subjective Pt is a 54yo [place] female with limited English, presenting with husband to help translate. Pt had an acute episode of LBP without sciatica about 1 month ago, which gradually worsened after attempts of conservative management to the point of limited mobility and visit to hospital. Pt has also had pain symptoms for for the last 9 months with pain into the R arm.  Since DC, pt has since had neurology appointment.  Neurologist has r/o any bony impingement of nerves in neck or lower back.     Patient is accompained by: Family member   Pertinent History No prior history of back pain. C section 25 years ago, no related pain thereafter.    Limitations Sitting;Standing   How long can you sit comfortably? About 1 hour tolerance before stiffness on L back from pelvis to mid back. Now gets up and shifts position every 30 minutes.    How long can you stand comfortably? About 30 miutes, but she avoids standing for longer at this point to avoid exacerabtion.    Diagnostic tests Xrays, MRI    Currently in Pain? No/denies   Pain Score 0-No pain   Pain Location Shoulder   Pain Orientation Right   Aggravating Factors  Worse with washing hair, seatbelt; does not aggravated with bra or dressing.  (4/10 showering this morning)    Multiple Pain Sites Yes   Pain Location Back   Pain Orientation Left;Lateral;Lower;Mid   Aggravating Factors  Pain worse first thing in morning, and better once she gets moving; (3/10 upon waking, 7/10 initialy upon standing)   Effect of Pain on Daily Activities Used to Sleep on Right side, but has been lying on back because of pain in shoulder.             [facility] PT Assessment - 06/26/15  0001    Assessment   Medical Diagnosis L LBP, R shoulder Pain    Referring Provider Elliot Cousin   Onset Date/Surgical Date 05/28/15  09/2014 (shoulders)    Hand Dominance Right   Prior Therapy Tried Chiro 2x, c acupuncture, and dry needling  [place] [facility] injection in May 2016 (left shoulder)    Precautions   Precautions None   Restrictions   Weight Bearing Restrictions No   Balance Screen   Has the patient fallen in the past 6 months No   Has the patient had a decrease in activity level because of a fear of falling?  Yes   Is the patient reluctant to leave their home because of a fear of falling?  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    Vocation Requirements sitting upright   Sensation   Light Touch Appears Intact  Tested in UE only.    Posture/Postural Control   Posture/Postural Control Postural limitations   Posture Comments Chronically, habitually in erect postuure with global capsular loss of motion throghout the lumbar, thoracic and cervical spines.    AROM   Overall AROM  Within functional limits for tasks performed  With exception to the following   Right Shoulder Extension 30 Degrees   Right Shoulder Flexion 100 Degrees   Right Shoulder ABduction 50 Degrees   Right Shoulder Internal Rotation 75 Degrees  T9   Right Shoulder External Rotation 15 Degrees  C7   Left Shoulder Extension 30 Degrees   Left Shoulder Flexion 100 Degrees   Left Shoulder ABduction 50 Degrees   Left Shoulder Internal Rotation 75 Degrees  T6   Left Shoulder External Rotation 15 Degrees  T1   Strength   Right Shoulder Flexion 4+/5  tested at 80 degrees   Right Shoulder Extension 5/5   Right Shoulder ABduction 5/5  Tested at 50 degrees (end of range, firm end feel))   Right Shoulder Internal Rotation 4/5  tested at 0 degrees shoulder flexion   Right Shoulder External Rotation 4/5  tested at belly   Left Shoulder Flexion 4+/5  tested at 80 degrees   Left Shoulder Extension 5/5   Left Shoulder ABduction 5/5  (end of range, firm end feel))   Left Shoulder Internal Rotation 4/5  tested at 0 degrees shoulder flexion   Left Shoulder External Rotation 4/5  tested at belly   Right Elbow Flexion 5/5   Right Elbow Extension 4/5   Left Elbow Flexion 5/5   Left Elbow Extension 4/5   Right Wrist Flexion 5/5   Right Wrist Extension 5/5   Left Wrist Flexion 5/5   Left Wrist Extension 5/5   Right Hand Gross Grasp Functional  finger abduction, adduction, and pollux ext 5/5   Left Hand Gross Grasp Functional  finger abduction, adduction, and pollux ext 5/5    Flexibility   Soft Tissue Assessment /Muscle Length yes  R deltoid latent with palpable triggerpoints and painful.    Quadratus Lumborum L side tightness   Palpation   Spinal mobility Limited lumbar flexion, extension; Limitd thoracic flexion with toe touch.    Palpation comment Mild tightness along the L quadratus lumborum, L paraspinals.                    [facility] Adult PT Treatment/Exercise - 06/26/15 0001    Lumbar Exercises: Stretches   Single Knee to  Chest Stretch 3 reps;30 seconds  bilat, Taught for HEP   Double Knee to Chest Stretch 3 reps  3x45 seconds, taught for HEP   Lower Trunk Rotation 10 seconds  hooklying, 10x10sec bilat, taught for HEP   Shoulder Exercises: Seated   Retraction AROM;10 reps;Both  10x3second hold scapulae only.   Shoulder Exercises: Stretch   Table Stretch - Flexion 10 seconds  10x10s, taught for HEP, flexion and abduction                PT Education - 06/26/15 0832    Education provided Yes   Education Details Did best to explain findings from shoulder exam, nature and prognosis or frozen shoulder, what OT is as a profession and how/why they will be used to provide treatment for condition after independent evaluation.    Person(s) Educated Patient;Spouse   Methods Explanation;Demonstration;Handout   Comprehension Verbalized understanding;Need further instruction          PT Short Term Goals - 06/26/15 0857    PT SHORT TERM GOAL #1   Title After 2 weeks, pt will demonstrate independence in a starter HEP focused on improving lumbo thoracic mobility and pain management.    PT SHORT TERM GOAL #2   Title After 3 weeks, pt will be able to tolerate sitting for 45 minutes without exacerbation of stiffness in lowerback.    PT SHORT TERM GOAL #3   Title After 3 weeks, pt will be able to tolerate standing for 30 minutes without exacerbation of low back symptoms.            PT Long Term Goals - 06/26/15 0900    PT LONG TERM GOAL  #1   Title After 6 weeks, pt will be able to demonstrate improved lumbar mobility, with a minimum of 15 degrees lumbar lordosis, and 25 degrees lumbar flexion, to improve body mechancis and activity tolerance to more diverse postural changes.    PT LONG TERM GOAL #2   Title After 6 weeks patient will be able to report improved sitting tolerance to 90 minutes to facilitate leisure activities and job duties that reqire prolonged sitting.    PT LONG TERM GOAL #3   Title After 6 weeks, patient will demonstrate ability to perform a deep squat with hips greater than 120 degrees flexion while maintaining lumbar lordosis for a sustained 30 second hold to demonstrate core stability during functional movement.                Plan - 06/26/15 0836    Clinical Impression Statement Pt presents with husband today to translate as needed. Pt demonstrating vast improvement after DC from [facility] with stretches, and medications. Pt has since had visit with neuro who has ruled out and neurological etiology related to CC. All light touch sensation is intact. Pt demonstrating hypomobility in the lumbar and thoracic spine and muscle hypertonicicty along the L postural extensors of the trunk. Investigation of shoulder complaints reveals strength within normal limits, a capsular pattern loss of ROM in bilat glenohumeral joints, most severe in external rotation, flexion and abduction, and myofascial trigger points in the R deltoid, likely from chronic attempts at functional UE use into joint range restrictions. In light of subjective history, shoulders are presenting with classic presentation of adhesive capsulitis bialterally, however, it is not clear as to whether this has been formally diagnosed and there is no referral to treat it at this time. I have communicated with patient to seek a [facility] and obtain a Rx for OT so the shoulders can be more thoroughly evaluated and treated.    Pt will benefit from  skilled therapeutic intervention in order to improve on the following deficits Decreased range of motion;Pain;Hypomobility;Decreased activity tolerance;Decreased mobility;Decreased strength;Increased muscle spasms;Impaired perceived functional ability;Impaired flexibility;Impaired UE functional use;Improper body mechanics;Postural dysfunction   Clinical Impairments Affecting Rehab Potential Severe shoulder mobility limitations.    PT Frequency 2x / week   PT Duration 6 weeks   PT Treatment/Interventions Therapeutic exercise;Therapeutic activities;Moist Heat;Manual techniques;Patient/family education;Passive range of motion   PT Next Visit Plan Teach table slides with R deviation for HEP stretch; functional squats with emphasis on lordosis, seated hip flexion, cat & camel, 3-D thoracic excursion. Screen PIIVMs, SIJ instability, and lumbar softissue; full lower body MMT.    PT Home Exercise Plan Issued today, updated next visit (see above)   Recommended Other Services Ortho FU for shoulders, OT evaluation   Consulted and Agree with Plan of Care Patient;Family member/caregiver         Problem List Patient Active Problem List   Diagnosis Date Noted  . DDD (degenerative disc disease), lumbar   . Inability to walk   . Weakness   . Left lumbar radiculopathy 06/17/2015  . Intractable back pain 06/16/2015  . Cervical radiculopathy at C6 06/16/2015  . DDD (degenerative disc disease), cervical     Yoceline Bazar C 06/26/2015, 9:16 AM  9:16 AM  Janet LintsAllan C Sharicka Pogorzelski, PT, DPT [place] License # [id number]       [facility] [address] [place], [place], [address] Phone: [phone]   Fax:  [phone]  Name: Janet MayerJin Barbone MRN: [id number] Date of Birth: 07-30-1960

## 2015-07-10 ENCOUNTER — Ambulatory Visit (HOSPITAL_COMMUNITY): Payer: BLUE CROSS/BLUE SHIELD

## 2015-07-10 DIAGNOSIS — M62830 Muscle spasm of back: Secondary | ICD-10-CM

## 2015-07-10 DIAGNOSIS — R293 Abnormal posture: Secondary | ICD-10-CM

## 2015-07-10 DIAGNOSIS — M25611 Stiffness of right shoulder, not elsewhere classified: Secondary | ICD-10-CM

## 2015-07-10 DIAGNOSIS — R52 Pain, unspecified: Secondary | ICD-10-CM

## 2015-07-10 DIAGNOSIS — M2569 Stiffness of other specified joint, not elsewhere classified: Secondary | ICD-10-CM

## 2015-07-10 DIAGNOSIS — M2560 Stiffness of unspecified joint, not elsewhere classified: Secondary | ICD-10-CM

## 2015-07-10 DIAGNOSIS — M25612 Stiffness of left shoulder, not elsewhere classified: Secondary | ICD-10-CM

## 2015-07-10 DIAGNOSIS — M545 Low back pain, unspecified: Secondary | ICD-10-CM

## 2015-07-14 NOTE — Therapy (Addendum)
[place], [place], [address] Phone: [phone]   Fax:  [phone]  Physical Therapy Treatment  Patient Details  Name: Loney Peto MRN: [id number] Date of Birth: 11-08-1960 Referring Provider: Rexene Alberts  Encounter Date: 07/10/2015      PT End of Session - 07/14/15 0944    Visit Number 2   Number of Visits 6   Date for PT Re-Evaluation 07/26/15   Authorization Type BCBS   Authorization Time Period 06/25/15-08/25/14   Authorization - Visit Number 2   PT Start Time 6761   PT Stop Time 1356   PT Time Calculation (min) 52 min   Activity Tolerance Patient tolerated treatment well;No increased pain   Behavior During Therapy [facility] for tasks assessed/performed      Past Medical History  Diagnosis Date  . Shoulder injury     Past Surgical History  Procedure Laterality Date  . Abdominal hysterectomy      There were no vitals filed for this visit.  Visit Diagnosis:  Left-sided low back pain without sciatica  Back stiffness  Spasm of back muscles  Postural imbalance  Decreased shoulder mobility, left  Decreased shoulder mobility, right  Pain aggravated by sitting  Pain aggravated by standing      Subjective Assessment - 07/14/15 1015    Subjective Pt reports she began to have shooting sciatic pain on L  side on 12/17 (two days after last visit); She continues to have same pain each morning, that takes about 30 minute-hours to resolve. Pt has been working additional UE exercises at [employer]  and swimming.    Patient is accompained by: Family member   Pertinent History No prior history of back pain. C section 25 years ago, no related pain thereafter.    Limitations Sitting;Standing   How long can you sit comfortably? About 1 hour tolerance before stiffness on L back from pelvis to mid back. Now gets up and shifts position every 30 minutes.    How long can you stand comfortably? About 30 miutes, but she  avoids standing for longer at this point to avoid exacerabtion.    Diagnostic tests Xrays, MRI, twice in December 2016    Currently in Pain? No/denies                         [facility] Adult PT Treatment/Exercise - 07/14/15 0001    Lumbar Exercises: Standing   Other Standing Lumbar Exercises Repeated lumbar flexion x15  Repeated Lumbar extension x15   Other Standing Lumbar Exercises Repeated lumbar sidebending bilat x15  Floor squats 1x15   Lumbar Exercises: Prone   Other Prone Lumbar Exercises Seated thoracic extension overchairback: 1x15                PT Education - 07/14/15 0942    Education provided Yes   Education Details Discussed concerns over new symptoms, and recommended again getting established with GP. Pt asks to return to neurologist, and I am agreeable.    Person(s) Educated Patient;Spouse   Methods Explanation   Comprehension Verbalized understanding          PT Short Term Goals - 06/26/15 0857    PT SHORT TERM GOAL #1   Title After 2 weeks, pt will demonstrate independence in a starter HEP focused on improving lumbo thoracic mobility and pain management.    PT SHORT TERM GOAL #2   Title After 3 weeks, pt will be  able to tolerate sitting for 45 minutes without exacerbation of stiffness in lowerback.    PT SHORT TERM GOAL #3   Title After 3 weeks, pt will be able to tolerate standing for 30 minutes without exacerbation of low back symptoms.            PT Long Term Goals - 06/26/15 0900    PT LONG TERM GOAL #1   Title After 6 weeks, pt will be able to demonstrate improved lumbar mobility, with a minimum of 15 degrees lumbar lordosis, and 25 degrees lumbar flexion, to improve body mechancis and activity tolerance to more diverse postural changes.    PT LONG TERM GOAL #2   Title After 6 weeks patient will be able to report improved sitting tolerance to 90 minutes to facilitate leisure activities and job duties that reqire prolonged sitting.     PT LONG TERM GOAL #3   Title After 6 weeks, patient will demonstrate ability to perform a deep squat with hips greater than 120 degrees flexion while maintaining lumbar lordosis for a sustained 30 second hold to demonstrate core stability during functional movement.                Plan - 07/14/15 0945    Clinical Impression Statement Mini-assessment performed today, as pt primary back symptoms have resolved since last visit, but with new radicular symptoms. Pt reports LLE sciatic pain from buttocks to heel upon waking each morning, which she 'works out' for 30-45 minutes starting with stretching in bed, and moving to slow and awkward ambulatory activity. Pt reports that symptoms resolve completely after an hour and do not return until the next day.  She reports she has experienced this phenomenon each day for the past two weeks. BLE testing of strength reveals 5/5 strength in all areas, and light touch sensation is intact bilaterally. Provocation of the SI joints/pelvis unremarkable. Soft tissue assessment of the lumbar and thoracic region reveals symmetrical muscle tone/mass, uniformly supple and pain free upon assessment. Pt continues to demonstrate limited lumbar and thoracic mobility with all functional activities and assessment of ROM. Testing of repeated movements does not bring on pt symptoms. Slump test is negative bilat, making involvement of lumbar discopathy statistically unlikely at this point. The slump testing does create some discomfort along the Left lateral lumbar region. Spring testing of lumbar spine is very uncomfortable, intermittent muscle guarding, and with poor segmental mobility. Assessment of posterior hip muscularture, inititally is able to identify palpable knots in soft tissue, which create 'lightning shooting pain' that pt says is familiar, but then quickly subsides. Application of pressure to this area creates very strong, localized DTR to this area 3 times.  Within  this mini assessment I am unable to recreate symptoms. Due to the unfamiliar nature of these new symptoms and concerns that they may not be mechanical in nature, I am referring pt back to neurologist, as well as recommending establishment with a local GP for further work-up and differential diagnosis.    Pt will benefit from skilled therapeutic intervention in order to improve on the following deficits Decreased range of motion;Pain;Hypomobility;Decreased activity tolerance;Decreased mobility;Decreased strength;Increased muscle spasms;Impaired perceived functional ability;Impaired flexibility;Impaired UE functional use;Improper body mechanics;Postural dysfunction   Clinical Impairments Affecting Rehab Potential Severe shoulder mobility limitations.    PT Frequency 2x / week   PT Duration 6 weeks   PT Treatment/Interventions Therapeutic exercise;Therapeutic activities;Moist Heat;Manual techniques;Patient/family education;Passive range of motion   PT Next Visit Plan Teach table slides with  R deviation for HEP stretch; functional squats with emphasis on lordosis, seated hip flexion, cat & camel, 3-D thoracic excursion. Screen PIIVMs, SIJ instability, and lumbar softissue; full lower body MMT.    PT Home Exercise Plan No updates; encouraged to stay active at gym ad lib, and to continue with table slide exercises to address shoulder mobility deficits.    Recommended Other Services Return to neuro for new back symptoms; establish with GP.    Consulted and Agree with Plan of Care Family member/caregiver  They are reluctant to continue with PT at this time, as well as reluctance to establish with GP, difficulty in understanding the benefit.    Family Member Consulted husband        Problem List Patient Active Problem List   Diagnosis Date Noted  . DDD (degenerative disc disease), lumbar   . Inability to walk   . Weakness   . Left lumbar radiculopathy 06/17/2015  . Intractable back pain 06/16/2015   . Cervical radiculopathy at C6 06/16/2015  . DDD (degenerative disc disease), cervical     Kailash Hinze C 07/14/2015, 10:17 AM  10:17 AM  Etta Grandchild, PT, DPT [place] License # [id number]       [facility] [address] [place], [place], [address] Phone: [phone]   Fax:  [phone]  Name: Leyana Whidden MRN: [id number] Date of Birth: [birth year]   PHYSICAL THERAPY DISCHARGE SUMMARY  Visits from Start of Care: 2 including evaluation.   Current functional level related to goals / functional outcomes: *see above   Remaining deficits: *see above    Education / Equipment: *see above  Plan: Patient agrees to discharge.  Patient goals were not met. Patient is being discharged due to                                                     ????? Referred back to neuro for continued problem. Referred to OT for shoulders.     3:39 PM, 09/02/2015 Etta Grandchild, PT, DPT PRN Physical Therapist at [place] # [id number] [phone] (office)

## 2015-07-14 NOTE — Patient Instructions (Signed)
Asked pt to continue with home program as previously laid out, no changes at this time.

## 2015-07-20 ENCOUNTER — Telehealth (HOSPITAL_COMMUNITY): Payer: Self-pay

## 2015-07-20 NOTE — Telephone Encounter (Signed)
Husband called to cx both PT tx and OT eval. they will go see Neuro Surgeon and then call back if our services are needed. NF 07/20/15

## 2015-07-20 NOTE — Telephone Encounter (Signed)
Husband called and cx all future apptments, they will go see a Neuro Surgeon before they continue treatments. They will call back if they need us. NF 07/20/15

## 2015-07-22 ENCOUNTER — Encounter (HOSPITAL_COMMUNITY): Payer: BLUE CROSS/BLUE SHIELD

## 2015-07-22 ENCOUNTER — Ambulatory Visit (HOSPITAL_COMMUNITY): Payer: BLUE CROSS/BLUE SHIELD

## 2017-04-24 IMAGING — CT CT CERVICAL SPINE W/O CM
3 series · 14 of 33 positions shown, 17 images · non-contrast
Comparison: None.

CLINICAL DATA: Neck pain after standing from floor. Recent
chiropractor visit for back pain.

EXAM:
CT CERVICAL SPINE WITHOUT CONTRAST
TECHNIQUE: Multidetector CT imaging of the cervical spine was performed without
intravenous contrast. Multiplanar CT image reconstructions were also
generated.

[Series 3: cervical 2.0 st axial · axial · 0.33mm/px · z∈[+999,+1145]mm · 6 of 96 slices shown, 8 images]
[im 15/96  soft-tissue]
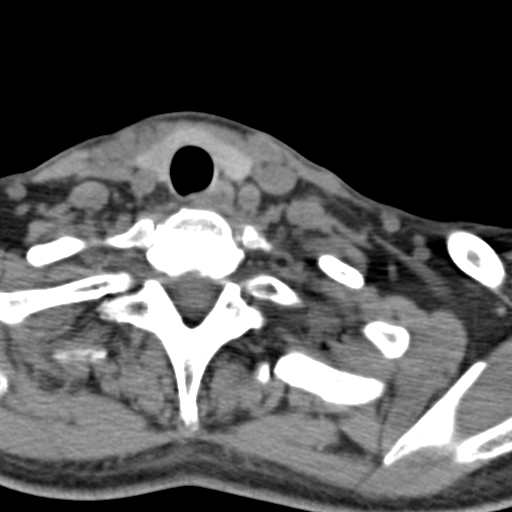
[im 15/96  bone]
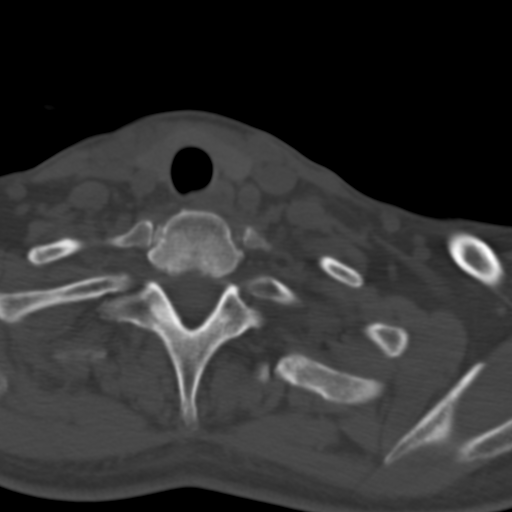
[im 30/96  bone]
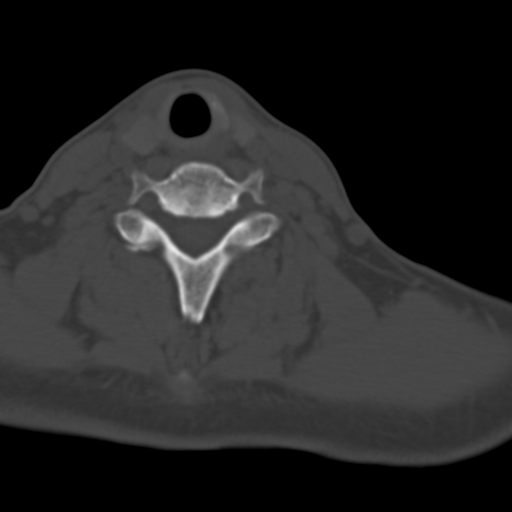
[im 44/96  bone]
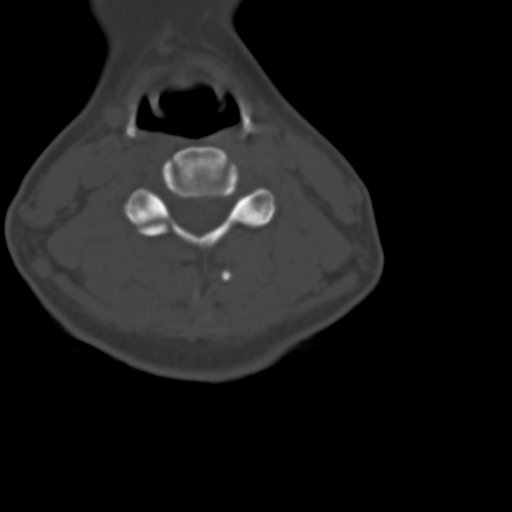
[im 59/96  bone]
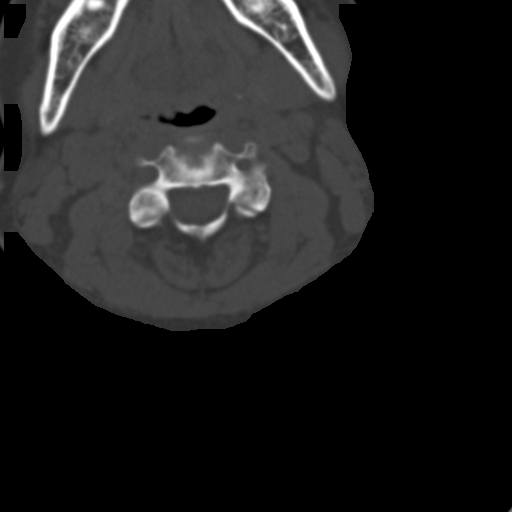
[im 74/96  soft-tissue]
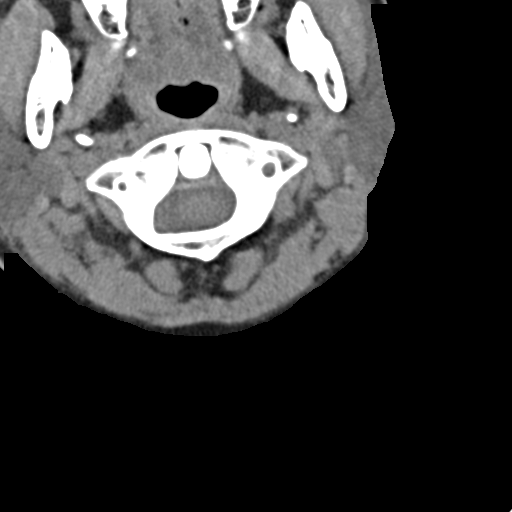
[im 74/96  bone]
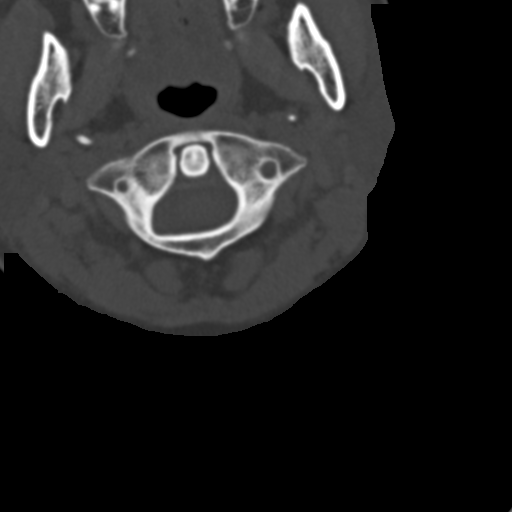
[im 88/96  bone]
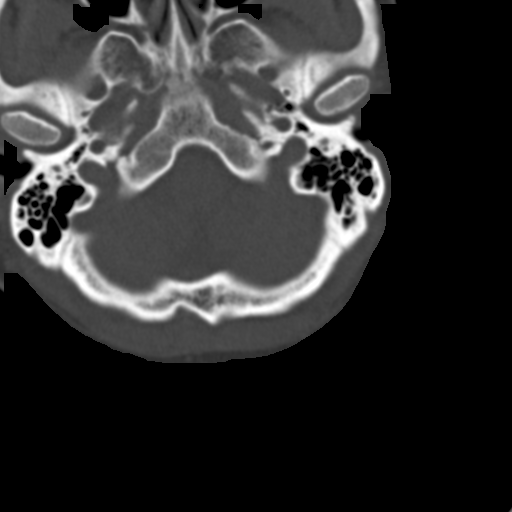

[Series 5: cervical spine sagittal bone · sagittal · 0.31mm/px · 5 of 66 slices shown, 6 images]
[im 22/66  bone]
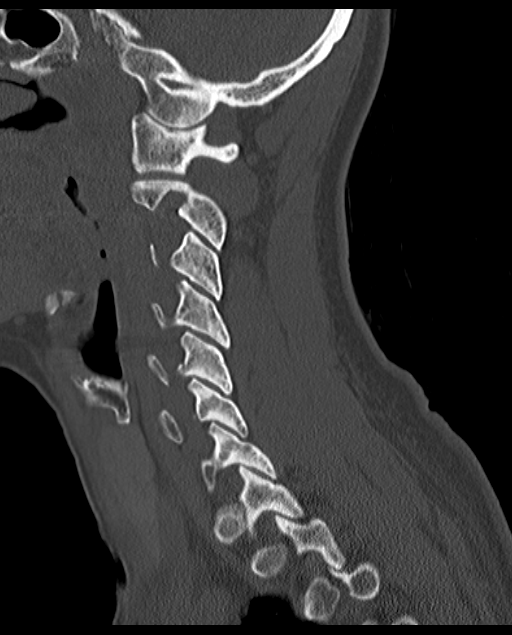
[im 28/66  bone]
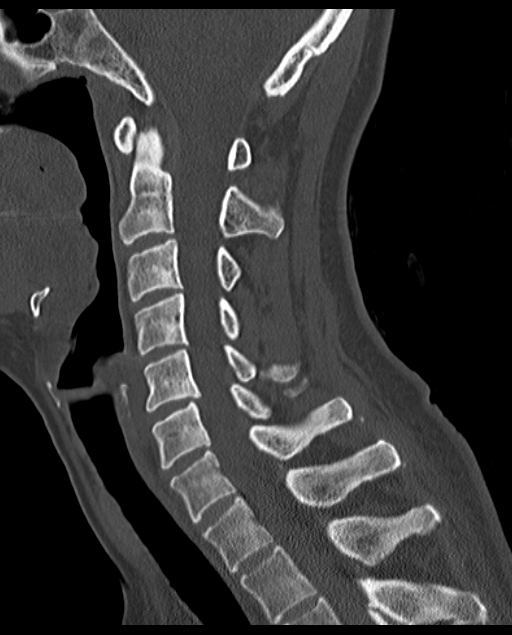
[im 33/66  soft-tissue]
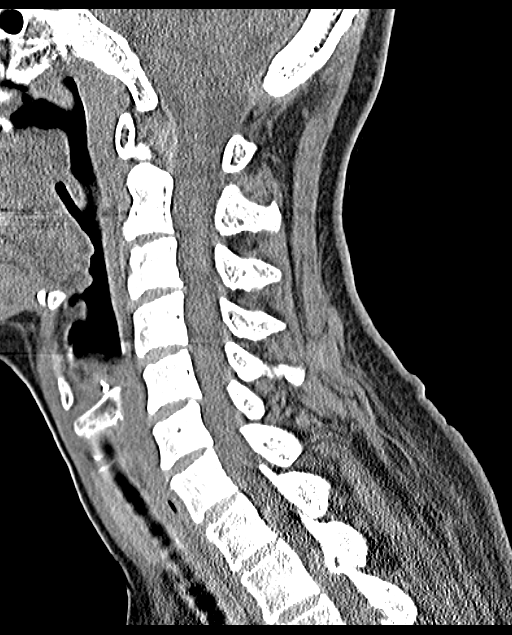
[im 33/66  bone]
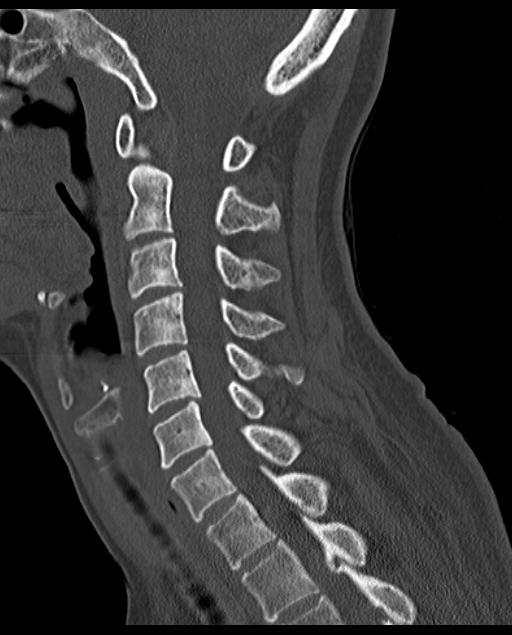
[im 38/66  bone]
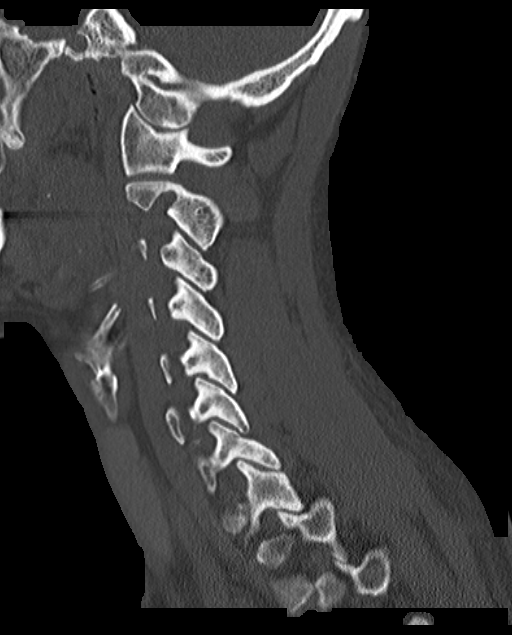
[im 44/66  bone]
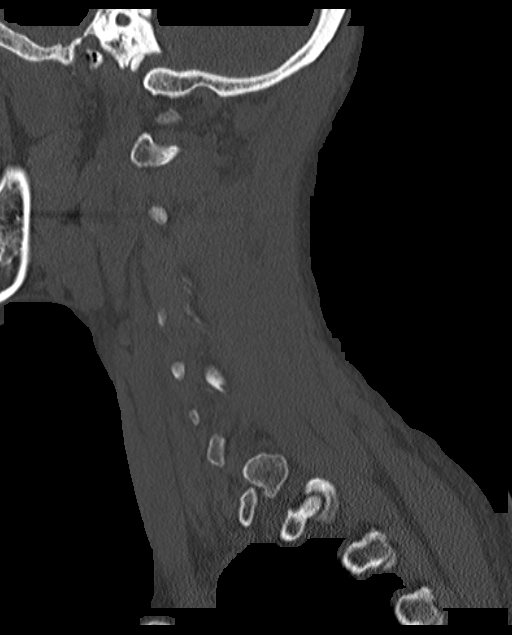

[Series 6: cervical spine coronal bone · coronal · 0.28mm/px · 3 of 80 slices shown]
[im 16/80  bone]
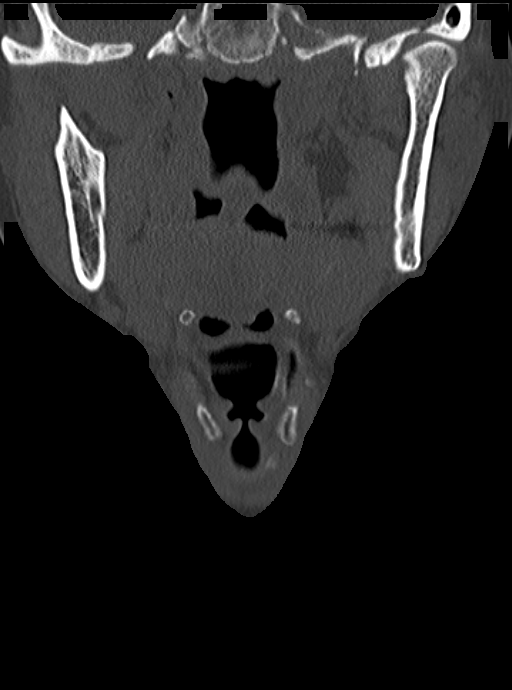
[im 32/80  bone]
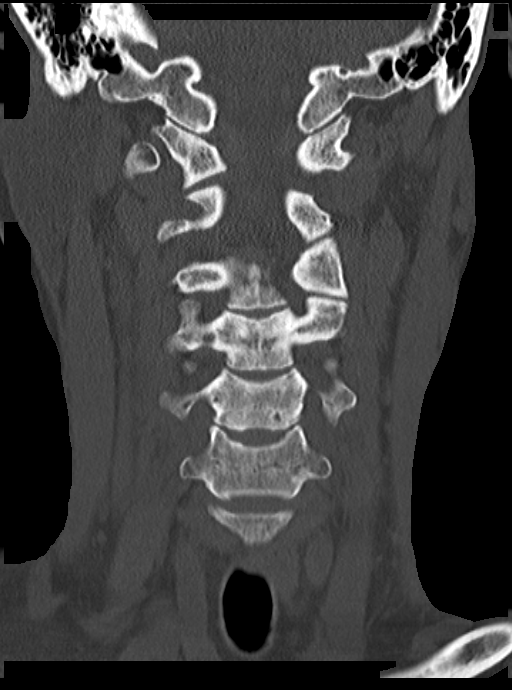
[im 48/80  bone]
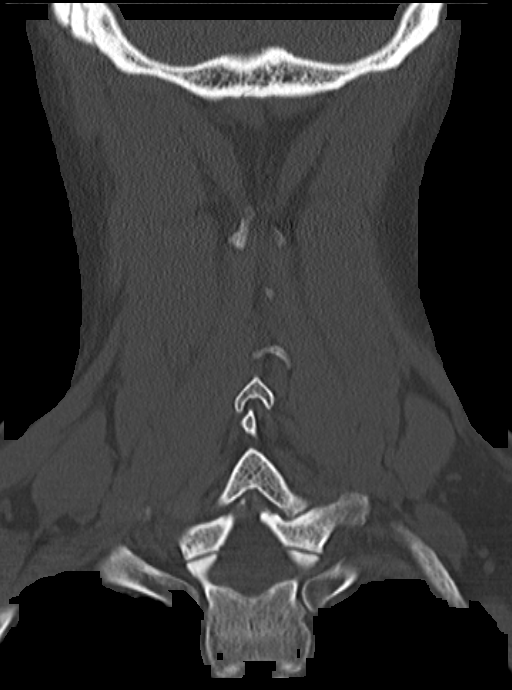

[14 of 33 positions shown; findings below may reference images not displayed]

FINDINGS: Ossification of the posterior longitudinal ligament extending from
the C7 level down to the lower T1 level as shown on images 31-32 of
series 5.

2 mm posterior subluxation at C5-6 thought to be degenerative.

Uncinate spurring, mild facet arthropathy, and possible degenerative
disc disease in the right neural foramen at C5-6 a potential cause
for right foraminal impingement. There is also some left uncinate
spurring at C5-6 and C6-7 without overt osseous foraminal stenosis
at these levels.

No prevertebral soft tissue swelling. I suspect small central disc
protrusions at C2-3 and C3-4 and there is potentially mild central
narrowing of the thecal sac at C5-6 due to disc bulge.

Biapical pleural parenchymal scarring appears relatively symmetric.

Upper normal size bilateral station 2 lymph nodes including an 8 mm
short axis lymph node on image 35 series 3.
IMPRESSION: 1. Cervical spondylosis and degenerative disc disease with potential
impingement at C5-6.
2. Ossification of the posterior longitudinal ligament at C7 and T1.
3. 2 mm degenerative posterior subluxation at C5-6.
4. In the lungs, there is biapical pleural parenchymal scarring
which appear symmetric.
5. Small central disc protrusions at C2- 3 and C3-4 with borderline
associated central narrowing of the thecal sac.
